# Patient Record
Sex: Female | Born: 1960 | Race: White | Hispanic: No | Marital: Married | Smoking: Former smoker
Health system: Southern US, Community
[De-identification: ages and names within clinical notes are randomized; demographics above are authoritative.]

## PROBLEM LIST (undated history)

## (undated) DIAGNOSIS — J309 Allergic rhinitis, unspecified: Secondary | ICD-10-CM

## (undated) DIAGNOSIS — E559 Vitamin D deficiency, unspecified: Secondary | ICD-10-CM

## (undated) DIAGNOSIS — E079 Disorder of thyroid, unspecified: Secondary | ICD-10-CM

## (undated) DIAGNOSIS — E7800 Pure hypercholesterolemia, unspecified: Secondary | ICD-10-CM

## (undated) HISTORY — DX: Disorder of thyroid, unspecified: E07.9

## (undated) HISTORY — DX: Pure hypercholesterolemia, unspecified: E78.00

## (undated) HISTORY — PX: APPENDECTOMY: SHX54

## (undated) HISTORY — DX: Allergic rhinitis, unspecified: J30.9

## (undated) HISTORY — DX: Vitamin D deficiency, unspecified: E55.9

---

## 1981-07-10 HISTORY — PX: FASCIOTOMY: SHX132

## 2005-10-20 ENCOUNTER — Ambulatory Visit: Payer: Self-pay | Admitting: General Surgery

## 2006-11-23 ENCOUNTER — Ambulatory Visit: Payer: Self-pay

## 2008-05-13 ENCOUNTER — Ambulatory Visit: Payer: Self-pay | Admitting: Family Medicine

## 2008-06-24 DIAGNOSIS — D239 Other benign neoplasm of skin, unspecified: Secondary | ICD-10-CM

## 2008-06-24 HISTORY — DX: Other benign neoplasm of skin, unspecified: D23.9

## 2008-12-18 ENCOUNTER — Ambulatory Visit: Payer: Self-pay

## 2009-11-12 ENCOUNTER — Ambulatory Visit: Payer: Self-pay | Admitting: Family Medicine

## 2011-07-10 ENCOUNTER — Ambulatory Visit: Payer: Self-pay | Admitting: Family Medicine

## 2012-02-09 ENCOUNTER — Ambulatory Visit: Payer: Self-pay | Admitting: Family Medicine

## 2012-12-03 ENCOUNTER — Ambulatory Visit: Payer: Self-pay | Admitting: Family Medicine

## 2015-03-09 DIAGNOSIS — E559 Vitamin D deficiency, unspecified: Secondary | ICD-10-CM | POA: Insufficient documentation

## 2015-03-09 DIAGNOSIS — E7800 Pure hypercholesterolemia, unspecified: Secondary | ICD-10-CM | POA: Insufficient documentation

## 2015-03-09 DIAGNOSIS — Z720 Tobacco use: Secondary | ICD-10-CM | POA: Insufficient documentation

## 2015-03-09 DIAGNOSIS — E039 Hypothyroidism, unspecified: Secondary | ICD-10-CM | POA: Insufficient documentation

## 2015-03-09 DIAGNOSIS — J309 Allergic rhinitis, unspecified: Secondary | ICD-10-CM | POA: Insufficient documentation

## 2015-03-10 ENCOUNTER — Ambulatory Visit (INDEPENDENT_AMBULATORY_CARE_PROVIDER_SITE_OTHER): Payer: BC Managed Care – PPO | Admitting: Family Medicine

## 2015-03-10 ENCOUNTER — Encounter: Payer: Self-pay | Admitting: Family Medicine

## 2015-03-10 VITALS — BP 102/70 | HR 87 | Temp 97.7°F | Resp 14 | Ht 63.5 in | Wt 131.6 lb

## 2015-03-10 DIAGNOSIS — J301 Allergic rhinitis due to pollen: Secondary | ICD-10-CM | POA: Diagnosis not present

## 2015-03-10 DIAGNOSIS — Z720 Tobacco use: Secondary | ICD-10-CM | POA: Diagnosis not present

## 2015-03-10 DIAGNOSIS — E039 Hypothyroidism, unspecified: Secondary | ICD-10-CM

## 2015-03-10 DIAGNOSIS — Z0000 Encounter for general adult medical examination without abnormal findings: Secondary | ICD-10-CM | POA: Diagnosis not present

## 2015-03-10 DIAGNOSIS — E559 Vitamin D deficiency, unspecified: Secondary | ICD-10-CM

## 2015-03-10 MED ORDER — FLUTICASONE PROPIONATE 50 MCG/ACT NA SUSP: 2.0000 | Freq: Every day | NASAL | Status: DC

## 2015-03-10 NOTE — Progress Notes (Signed)
Subjective:     Patient ID: Cindy Rangel, female   DOB: [birth year], 54 y.o.   MRN: [id number]  HPI  Chief Complaint  Patient presents with  . Annual Exam    Patient presents in office today for her annual physical, she states that she has no questions or concerns at this time. Last tdap 03/26/2009, Pap smear with HPV 02/12/14 negative, Colonoscopy 10/20/05 normal. Patient is due today for screening labs and mammogram  States she also wishes refills on her allergy medication.   Review of Systems General: Feeling well HEENT: regular dental visits and eye exams (contacts). _+ seasonal allergies usually treated with Claritin and Flonase spray. Cardiovascular: no chest pain, shortness of breath, or palpitations GI: no heartburn, no change in bowel habits  GU: nocturia x 1, no change in bladder habits  Psychiatric: not depressed: PHQ 2: 0 Musculoskeletal: no joint pain    Objective:   Physical Exam  Constitutional: She appears well-developed and well-nourished. No distress.  Eyes: PERRLA, EOMI Neck: no thyromegaly, tenderness or nodules, no cervical adenopathy, carotids palpable without bruit Breast: no mass/nipple discharge/axillary adenopathy  ENT: TM's intact without inflammation; No tonsillar enlargement or exudate, Lungs: Clear Heart : RRR without murmur or gallop Abd: bowel sounds present, soft, non-tender, no organomegaly Extremities: no edema Skin: no atypical lesions noted.     Assessment:    1. Annual physical exam - Comprehensive metabolic panel - Lipid panel - MM DIGITAL SCREENING BILATERAL; Future  2. Hypothyroidism, unspecified hypothyroidism type - T4, free - TSH  3. Allergic rhinitis due to pollen - fluticasone (FLONASE) 50 MCG/ACT nasal spray; Place 2 sprays into both nostrils daily.  Dispense: 16 g; Refill: 5  4. Vitamin D deficiency - Vit D  25 hydroxy (rtn osteoporosis monitoring)  5. Tobacco use: prior Chantix use with "bad dreams" and return to cigarette  use.     Plan:    Further f/u pending lab work. She intends to try nicotine product for smoking cessation.

## 2015-03-10 NOTE — Patient Instructions (Signed)
We will call you with lab results.       

## 2015-03-11 ENCOUNTER — Telehealth: Payer: Self-pay

## 2015-03-11 LAB — COMPREHENSIVE METABOLIC PANEL
A/G RATIO: 1.5 (ref 1.1–2.5)
ALK PHOS: 71 IU/L (ref 39–117)
ALT: 22 IU/L (ref 0–32)
AST: 22 IU/L (ref 0–40)
Albumin: 4.2 g/dL (ref 3.5–5.5)
BILIRUBIN TOTAL: 0.3 mg/dL (ref 0.0–1.2)
BUN/Creatinine Ratio: 30 — ABNORMAL HIGH (ref 9–23)
BUN: 19 mg/dL (ref 6–24)
CHLORIDE: 99 mmol/L (ref 97–108)
CO2: 24 mmol/L (ref 18–29)
Calcium: 9.6 mg/dL (ref 8.7–10.2)
Creatinine, Ser: 0.63 mg/dL (ref 0.57–1.00)
GFR calc Af Amer: 118 mL/min/{1.73_m2} (ref >59)
GFR, EST NON AFRICAN AMERICAN: 103 mL/min/{1.73_m2} (ref >59)
GLOBULIN, TOTAL: 2.8 g/dL (ref 1.5–4.5)
Glucose: 89 mg/dL (ref 65–99)
POTASSIUM: 4.6 mmol/L (ref 3.5–5.2)
SODIUM: 137 mmol/L (ref 134–144)
Total Protein: 7 g/dL (ref 6.0–8.5)

## 2015-03-11 LAB — T4, FREE: FREE T4: 1.56 ng/dL (ref 0.82–1.77)

## 2015-03-11 LAB — TSH: TSH: 3.16 u[IU]/mL (ref 0.450–4.500)

## 2015-03-11 LAB — LIPID PANEL
CHOL/HDL RATIO: 3.4 ratio (ref 0.0–4.4)
Cholesterol, Total: 233 mg/dL — ABNORMAL HIGH (ref 100–199)
HDL: 69 mg/dL (ref >39)
LDL Calculated: 148 mg/dL — ABNORMAL HIGH (ref 0–99)
TRIGLYCERIDES: 78 mg/dL (ref 0–149)
VLDL Cholesterol Cal: 16 mg/dL (ref 5–40)

## 2015-03-11 LAB — VITAMIN D 25 HYDROXY (VIT D DEFICIENCY, FRACTURES): Vit D, 25-Hydroxy: 23.8 ng/mL — ABNORMAL LOW (ref 30.0–100.0)

## 2015-03-11 NOTE — Telephone Encounter (Signed)
-----   Message from Carmon Ginsberg, [place] sent at 03/11/2015  7:49 AM EDT ----- Mild decrease in your Vitamin D should respond to 800 units daily over the counter. Thyroid is good at present dose. Cholesterol is mildly elevated but your 10 year risk for developing cardiovascular disease is low at 2.6%. Encourage trial on nicotine gum or patches to stop smoking.

## 2015-03-12 NOTE — Telephone Encounter (Signed)
[id number]-KW 

## 2015-03-16 NOTE — Telephone Encounter (Signed)
[id number]-KW 

## 2015-03-19 NOTE — Telephone Encounter (Signed)
Pt advised-aa 

## 2015-03-31 ENCOUNTER — Ambulatory Visit
Admission: RE | Admit: 2015-03-31 | Discharge: 2015-03-31 | Disposition: A | Discharge status: Home / Self Care | Payer: BC Managed Care – PPO | Source: Ambulatory Visit | Attending: Family Medicine | Admitting: Family Medicine

## 2015-03-31 ENCOUNTER — Ambulatory Visit: Payer: Self-pay

## 2015-03-31 DIAGNOSIS — Z0000 Encounter for general adult medical examination without abnormal findings: Secondary | ICD-10-CM | POA: Diagnosis present

## 2015-03-31 DIAGNOSIS — Z1231 Encounter for screening mammogram for malignant neoplasm of breast: Secondary | ICD-10-CM | POA: Insufficient documentation

## 2015-05-27 ENCOUNTER — Other Ambulatory Visit: Payer: Self-pay | Admitting: Family Medicine

## 2015-05-27 DIAGNOSIS — E039 Hypothyroidism, unspecified: Secondary | ICD-10-CM

## 2015-05-27 MED ORDER — LEVOTHYROXINE SODIUM 88 MCG PO TABS: 88.0000 ug | ORAL_TABLET | Freq: Every day | ORAL | Status: DC

## 2016-02-01 ENCOUNTER — Ambulatory Visit (INDEPENDENT_AMBULATORY_CARE_PROVIDER_SITE_OTHER): Payer: BC Managed Care – PPO | Admitting: Family Medicine

## 2016-02-01 ENCOUNTER — Encounter: Payer: Self-pay | Admitting: Family Medicine

## 2016-02-01 VITALS — BP 106/64 | HR 88 | Temp 98.4°F | Wt 130.0 lb

## 2016-02-01 DIAGNOSIS — J301 Allergic rhinitis due to pollen: Secondary | ICD-10-CM

## 2016-02-01 DIAGNOSIS — J40 Bronchitis, not specified as acute or chronic: Secondary | ICD-10-CM

## 2016-02-01 MED ORDER — CEFDINIR 300 MG PO CAPS: 300.0000 mg | ORAL_CAPSULE | Freq: Two times a day (BID) | ORAL | 0 refills | Status: DC

## 2016-02-01 NOTE — Progress Notes (Signed)
Subjective:     Patient ID: Cindy Rangel, female   DOB: 01/09/1961, 55 y.o.   MRN: [id number]  HPI  Chief Complaint  Patient presents with  . Bronchitis    Pt was seen June 3rd at Urgent Care for Bronchitis; she was prescribed an Antibiotic, inhaler and prednisone.  Pt report feeling better but still some coughing, wheezing, and feeling congested.   States she was placed on amoxicillin for 10 days but sputum did not clear.Reports continued cough with yellowish sputum but no shortness of breath. States she has mild clear sinus congestion c/w her allergies and she has resumed Flonase otc last night.   Review of Systems     Objective:   Physical Exam  Constitutional: She appears well-developed and well-nourished. No distress.  Ears: T.M's intact without inflammation Throat: no tonsillar enlargement or exudate Neck: no cervical adenopathy Lungs: posterior bases with transient inspiratory wheeze/coarse breath sounds.     Assessment:    1. Allergic rhinitis due to pollen  2. Bronchitis- cefdinir (OMNICEF) 300 MG capsule; Take 1 capsule (300 mg total) by mouth 2 (two) times daily.  Dispense: 14 capsule; Refill: 0    Plan:    Continue steroid nasal spray. Discussed use of Mucinex and Delsym.

## 2016-02-01 NOTE — Patient Instructions (Signed)
Continue steroid nasal spray. May add Mucinex or similar. Delsym for cough.

## 2016-04-19 ENCOUNTER — Ambulatory Visit (INDEPENDENT_AMBULATORY_CARE_PROVIDER_SITE_OTHER): Payer: BC Managed Care – PPO | Admitting: Physician Assistant

## 2016-04-19 ENCOUNTER — Encounter: Payer: Self-pay | Admitting: Physician Assistant

## 2016-04-19 VITALS — BP 106/78 | HR 80 | Temp 98.7°F | Resp 16 | Wt 128.0 lb

## 2016-04-19 DIAGNOSIS — R42 Dizziness and giddiness: Secondary | ICD-10-CM | POA: Diagnosis not present

## 2016-04-19 DIAGNOSIS — G4489 Other headache syndrome: Secondary | ICD-10-CM | POA: Diagnosis not present

## 2016-04-19 DIAGNOSIS — Z23 Encounter for immunization: Secondary | ICD-10-CM | POA: Diagnosis not present

## 2016-04-19 NOTE — Patient Instructions (Signed)

## 2016-04-19 NOTE — Progress Notes (Signed)
Patient: Cindy Rangel Female    DOB: 1961/01/23   55 y.o.   MRN: [id number] Visit Date: 04/19/2016  Today's Provider: Trinna Post, PA-C   Chief Complaint  Patient presents with  . Headache  . Dizziness   Subjective:    Headache   This is a new problem. The current episode started 1 to 4 weeks ago. The problem has been gradually worsening. The pain is located in the occipital and bilateral region. The pain does not radiate. The pain quality is not similar to prior headaches. The quality of the pain is described as dull and aching. The pain is at a severity of 3/10. The pain is mild. Associated symptoms include dizziness, rhinorrhea, scalp tenderness (Only on the right side pain comes and goes.) and a sore throat (Comes and goes.). Pertinent negatives include no abdominal pain, anorexia, blurred vision, coughing, ear pain, eye pain, eye redness, eye watering, fever, numbness, phonophobia, photophobia, seizures, sinus pressure, tinnitus, visual change or weakness.  Dizziness  This is a chronic problem. The current episode started more than 1 month ago. The problem has been gradually improving. Associated symptoms include headaches and a sore throat (Comes and goes.). Pertinent negatives include no abdominal pain, anorexia, chills, congestion, coughing, diaphoresis, fatigue, fever, numbness, visual change or weakness.   Patient reports new daily H/A x 1 mo, lasting for one hour. Headache goes away without medication. Headaches usually happen in afternoon or night. Not woken from sleep with headache. No eye discharge or rhinorrhea during headaches. No history of migraines, no aura. No changes in balance, vision, IVDU, no history of cancer. No trauma. Headaches are localized to top of head. 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 Patient reports allergies contorlled with steroid nasal spray. Patient recently went to family reunion and was concerned about the amount of diseases  in her family members.  Patient reports dizziness at different times of day after standing up from seated position. Patient describes "unsteadiness" that will go away shortly, and flushed feeling occasionally. Never Passed out. Doesn't happen while simply sitting or with exertion. Denies vertigo feeling, which she has had before. Dizziness not triggered by head movement or turning over in bed.    No Known Allergies   Current Outpatient Prescriptions:  .  Cholecalciferol (VITAMIN D) 2000 units CAPS, Take 1 capsule by mouth daily., Disp: , Rfl:  .  fluticasone (FLONASE) 50 MCG/ACT nasal spray, Place 2 sprays into both nostrils daily., Disp: 16 g, Rfl: 5 .  levothyroxine (SYNTHROID, LEVOTHROID) 88 MCG tablet, Take 1 tablet (88 mcg total) by mouth daily., Disp: 90 tablet, Rfl: 3 .  Multiple Vitamins-Minerals (MULTIVITAMIN GUMMIES WOMENS) CHEW, Chew by mouth., Disp: , Rfl:  .  Omega-3 Fatty Acids (FISH OIL) 1200 MG CAPS, Take by mouth., Disp: , Rfl:   Review of Systems  Constitutional: Negative for activity change, appetite change, chills, diaphoresis, fatigue, fever and unexpected weight change.  HENT: Positive for postnasal drip, rhinorrhea and sore throat (Comes and goes.). Negative for congestion, ear discharge, ear pain, nosebleeds, sinus pressure, sneezing, tinnitus, trouble swallowing and voice change.   Eyes: Negative for blurred vision, photophobia, pain, discharge, redness, itching and visual disturbance.  Respiratory: Negative for apnea, cough, choking, chest tightness, shortness of breath, wheezing and stridor.   Cardiovascular: Negative.   Gastrointestinal: Negative.  Negative for abdominal pain and anorexia.  Neurological: Positive for dizziness, light-headedness and headaches. Negative for tremors, seizures, syncope, weakness and  numbness.  Hematological: Does not bruise/bleed easily.    Social History  Substance Use Topics  . Smoking status: Current Every Day Smoker     Packs/day: 0.50    Types: Cigarettes  . Smokeless tobacco: Not on file  . Alcohol use 0.0 oz/week     Comment: rare   Objective:   BP 106/78 (BP Location: Left Arm, Patient Position: Sitting, Cuff Size: Normal)   Pulse 80   Temp 98.7 F (37.1 C) (Oral)   Resp 16   Wt 128 lb (58.1 kg)   BMI 22.32 kg/m   Outpatient Encounter Prescriptions as of 04/19/2016  Medication Sig Note  . Cholecalciferol (VITAMIN D) 2000 units CAPS Take 1 capsule by mouth daily.   . fluticasone (FLONASE) 50 MCG/ACT nasal spray Place 2 sprays into both nostrils daily.   Marland Kitchen levothyroxine (SYNTHROID, LEVOTHROID) 88 MCG tablet Take 1 tablet (88 mcg total) by mouth daily.   . Multiple Vitamins-Minerals (MULTIVITAMIN GUMMIES WOMENS) CHEW Chew by mouth. 03/09/2015: Received from: [facility]  . Omega-3 Fatty Acids (FISH OIL) 1200 MG CAPS Take by mouth. 03/09/2015: Received from: [facility]  . [DISCONTINUED] cefdinir (OMNICEF) 300 MG capsule Take 1 capsule (300 mg total) by mouth 2 (two) times daily.   . [DISCONTINUED] Vitamin D, Ergocalciferol, (DRISDOL) 50000 UNITS CAPS capsule Take by mouth. 03/09/2015: Received from: [facility]   No facility-administered encounter medications on file as of 04/19/2016.    Orthostatic Vital Signs Supine: 104/70, P64 Sit: 102/70, P68 Stand: 98/68, P76  Physical Exam  Constitutional: She is oriented to person, place, and time. She appears well-developed and well-nourished. No distress.  HENT:  Head: Normocephalic and atraumatic.  Right Ear: External ear normal.  Left Ear: External ear normal.  Nose: Nose normal.  Mouth/Throat: Oropharynx is clear and moist. No oropharyngeal exudate.  Eyes: Pupils are equal, round, and reactive to light. Right eye exhibits no discharge. Left eye exhibits no discharge.  Neck: Normal range of motion and full passive range of motion without pain. Neck supple. No muscular tenderness present. No  neck rigidity. No edema and normal range of motion present. No Kernig's sign noted.  Cardiovascular: Normal rate, regular rhythm and normal heart sounds.  Exam reveals no gallop and no friction rub.   No murmur heard. Pulmonary/Chest: Effort normal and breath sounds normal.  Neurological: She is alert and oriented to person, place, and time. No cranial nerve deficit or sensory deficit. She exhibits normal muscle tone. She displays a negative Romberg sign. Coordination and gait normal.  Skin: Skin is warm and dry. She is not diaphoretic.  Psychiatric: She has a normal mood and affect. Her behavior is normal.        Assessment & Plan:      Problem List Items Addressed This Visit    None    Visit Diagnoses    Dizziness    -  Primary   Relevant Orders   Comprehensive Metabolic Panel (CMET)   CBC with Differential   Other headache syndrome       Relevant Orders   Ambulatory referral to Neurology   Need for influenza vaccination       Relevant Orders   Flu Vaccine QUAD 36+ mos PF IM (Fluarix & Fluzone Quad PF) (Completed)     Dizziness Checking labs as above. Performed orthostatics today in the office, which were normal. Patient's dizziness occurs in context of standing up, not simply sitting there, turning her head, or with exertion.  No focal neurological deficits on exam. Patient admittedly does not drink enough fluids. No suspect medications. Push fluids, move from sitting to standing position slowly.   Headache Patient does not display many red flag symptoms for headache except for new onset and age greater than 30. Reassuring symptoms are lack of night time awakenings, no neck stiffness, no  history of cancer or IVDU, no trauma, no nausea/vomiting, no focal neurological deficits on exam. Counseled patient that she may treat symptomatically with pain relief, hydrate adequately, focus on sleep and stress relief. Will refer patient to neurology as patient is very concerned about these  symptoms. Patient may manage pain with Tylenol or NSAIDs. Safe limits discussed.  Return if symptoms worsen or fail to improve.       Patient Instructions  General Headache Without Cause A headache is pain or discomfort felt around the head or neck area. The specific cause of a headache may not be found. There are many causes and types of headaches. A few common ones are:  Tension headaches.  Migraine headaches.  Cluster headaches.  Chronic daily headaches. HOME CARE INSTRUCTIONS  Watch your condition for any changes. Take these steps to help with your condition: Managing Pain  Take over-the-counter and prescription medicines only as told by your health care provider.  Lie down in a dark, quiet room when you have a headache.  If directed, apply ice to the head and neck area:  Put ice in a plastic bag.  Place a towel between your skin and the bag.  Leave the ice on for 20 minutes, 2-3 times per day.  Use a heating pad or hot shower to apply heat to the head and neck area as told by your health care provider.  Keep lights dim if bright lights bother you or make your headaches worse. Eating and Drinking  Eat meals on a regular schedule.  Limit alcohol use.  Decrease the amount of caffeine you drink, or stop drinking caffeine. General Instructions  Keep all follow-up visits as told by your health care provider. This is important.  Keep a headache journal to help find out what may trigger your headaches. For example, write down:  What you eat and drink.  How much sleep you get.  Any change to your diet or medicines.  Try massage or other relaxation techniques.  Limit stress.  Sit up straight, and do not tense your muscles.  Do not use tobacco products, including cigarettes, chewing tobacco, or e-cigarettes. If you need help quitting, ask your health care provider.  Exercise regularly as told by your health care provider.  Sleep on a regular schedule. Get  7-9 hours of sleep, or the amount recommended by your health care provider. SEEK MEDICAL CARE IF:   Your symptoms are not helped by medicine.  You have a headache that is different from the usual headache.  You have nausea or you vomit.  You have a fever. SEEK IMMEDIATE MEDICAL CARE IF:   Your headache becomes severe.  You have repeated vomiting.  You have a stiff neck.  You have a loss of vision.  You have problems with speech.  You have pain in the eye or ear.  You have muscular weakness or loss of muscle control.  You lose your balance or have trouble walking.  You feel faint or pass out.  You have confusion.   This information is not intended to replace advice given to you by your health care provider. Make sure you  discuss any questions you have with your health care provider.   Document Released: 06/26/2005 Document Revised: 03/17/2015 Document Reviewed: 10/19/2014 Elsevier Interactive Patient Education 2016 [place], [facility]

## 2016-04-20 ENCOUNTER — Telehealth: Payer: Self-pay

## 2016-04-20 LAB — CBC WITH DIFFERENTIAL/PLATELET
Basophils Absolute: 0 10*3/uL (ref 0.0–0.2)
Basos: 1 %
EOS (ABSOLUTE): 0.1 10*3/uL (ref 0.0–0.4)
Eos: 2 %
Hematocrit: 41.2 % (ref 34.0–46.6)
Hemoglobin: 14.1 g/dL (ref 11.1–15.9)
Immature Grans (Abs): 0 10*3/uL (ref 0.0–0.1)
Immature Granulocytes: 0 %
Lymphocytes Absolute: 2.5 10*3/uL (ref 0.7–3.1)
Lymphs: 39 %
MCH: 30.3 pg (ref 26.6–33.0)
MCHC: 34.2 g/dL (ref 31.5–35.7)
MCV: 89 fL (ref 79–97)
Monocytes Absolute: 0.6 10*3/uL (ref 0.1–0.9)
Monocytes: 9 %
Neutrophils Absolute: 3 10*3/uL (ref 1.4–7.0)
Neutrophils: 49 %
Platelets: 297 10*3/uL (ref 150–379)
RBC: 4.65 x10E6/uL (ref 3.77–5.28)
RDW: 12.7 % (ref 12.3–15.4)
WBC: 6.3 10*3/uL (ref 3.4–10.8)

## 2016-04-20 LAB — COMPREHENSIVE METABOLIC PANEL
ALT: 24 IU/L (ref 0–32)
AST: 22 IU/L (ref 0–40)
Albumin/Globulin Ratio: 1.8 (ref 1.2–2.2)
Albumin: 4.6 g/dL (ref 3.5–5.5)
Alkaline Phosphatase: 81 IU/L (ref 39–117)
BUN/Creatinine Ratio: 40 — ABNORMAL HIGH (ref 9–23)
BUN: 29 mg/dL — ABNORMAL HIGH (ref 6–24)
Bilirubin Total: 0.3 mg/dL (ref 0.0–1.2)
CO2: 25 mmol/L (ref 18–29)
Calcium: 9.8 mg/dL (ref 8.7–10.2)
Chloride: 102 mmol/L (ref 96–106)
Creatinine, Ser: 0.72 mg/dL (ref 0.57–1.00)
GFR calc Af Amer: 110 mL/min/{1.73_m2} (ref >59)
GFR calc non Af Amer: 95 mL/min/{1.73_m2} (ref >59)
Globulin, Total: 2.6 g/dL (ref 1.5–4.5)
Glucose: 90 mg/dL (ref 65–99)
Potassium: 4.6 mmol/L (ref 3.5–5.2)
Sodium: 142 mmol/L (ref 134–144)
Total Protein: 7.2 g/dL (ref 6.0–8.5)

## 2016-04-20 NOTE — Telephone Encounter (Signed)
-----   Message from Trinna Post, [place] sent at 04/20/2016  9:06 AM EDT ----- CBC did not show signs of anemia. CMP consistent with dehydration. Push fluids to see if this helps with dizziness. Please advise patient, thank you.

## 2016-04-20 NOTE — Telephone Encounter (Signed)
lmtcb Efren Kross Drozdowski, CMA  

## 2016-04-24 NOTE — Telephone Encounter (Signed)
LMTCB

## 2016-04-25 NOTE — Telephone Encounter (Signed)
It might take a little longer for fluid volume to equilibrate. In absence of documented orthostatic hypotension and vertigo, may be little medication to be prescribed. But if she wishes, patient may come back in to be re-evaluated ito see if there are any new signs/symptoms.

## 2016-04-25 NOTE — Telephone Encounter (Signed)
Pt advised.  She reports that she has increased fluids and that has not helped.  She has an appointment with Neurology November 10th but she can't wait that long.    She says she is dizzy everyday and her work.   Please advised.   Thanks,   -Mickel Baas

## 2016-04-26 NOTE — Telephone Encounter (Signed)
Pt advised.   Thanks,   -Cash Duce  

## 2016-04-27 ENCOUNTER — Encounter: Payer: Self-pay | Admitting: Physician Assistant

## 2016-04-27 ENCOUNTER — Ambulatory Visit (INDEPENDENT_AMBULATORY_CARE_PROVIDER_SITE_OTHER): Payer: BC Managed Care – PPO | Admitting: Physician Assistant

## 2016-04-27 ENCOUNTER — Telehealth: Payer: Self-pay | Admitting: Physician Assistant

## 2016-04-27 VITALS — BP 128/70 | HR 76 | Temp 98.4°F | Resp 16 | Wt 128.0 lb

## 2016-04-27 DIAGNOSIS — R42 Dizziness and giddiness: Secondary | ICD-10-CM | POA: Diagnosis not present

## 2016-04-27 MED ORDER — PROCHLORPERAZINE MALEATE 5 MG PO TABS: 5.0000 mg | ORAL_TABLET | Freq: Four times a day (QID) | ORAL | 0 refills | Status: DC | PRN

## 2016-04-27 MED ORDER — MECLIZINE HCL 12.5 MG PO TABS: 12.5000 mg | ORAL_TABLET | Freq: Three times a day (TID) | ORAL | 0 refills | Status: AC | PRN

## 2016-04-27 NOTE — Patient Instructions (Signed)

## 2016-04-27 NOTE — Progress Notes (Signed)
Patient: Cindy Rangel Female    DOB: December 21, 1960   55 y.o.   MRN: [id number] Visit Date: 04/27/2016  Today's Provider: Trinna Post, PA-C   Chief Complaint  Patient presents with  . Dizziness   Subjective:    Dizziness  This is a recurrent problem. The current episode started 1 to 4 weeks ago. The problem occurs constantly. The problem has been gradually worsening. Associated symptoms include abdominal pain, congestion, fatigue, headaches, myalgias (Pt also complains of left leg pain.  ) and nausea. Pertinent negatives include no arthralgias, chills, diaphoresis, fever, joint swelling, neck pain, numbness, sore throat, vomiting or weakness.   Patient is a 55 y/o female w/ hypothyroidism who was seen last week in clinic for dizziness presenting today with ongoing complaints of dizziness. On her initial visit, patient described dizziness when standing up that quickly dissipated if she stood still. Denied feeling of vertigo which she has had before. Orthostatics and CBC were normal, CMP indicated dehydration. She pushed fluids with no relief. Today she is complaining of ongoing dizziness that worsens when she moves her head or moves in general. She denies the room spinning, but says she feels like she herself is spinning. She reports having to stay home from work and had to have her daughter drive her here because she felt unable to drive. She endorses a feeling of fullness in her ears bilaterally. She denies hearing loss, but reports when she holds the phone up to her left ear, the phone sounds "garbled." She reports she can understand me as I speak to her. She endorses some associated nausea, but no vomiting, She endorses a dull headache. She denies any trauma, fever, chills, neck stiffness. Patient says she feels better when she closes her eyes.  No Known Allergies   Current Outpatient Prescriptions:  .  Cholecalciferol (VITAMIN D) 2000 units CAPS, Take 1 capsule by mouth daily.,  Disp: , Rfl:  .  fluticasone (FLONASE) 50 MCG/ACT nasal spray, Place 2 sprays into both nostrils daily., Disp: 16 g, Rfl: 5 .  levothyroxine (SYNTHROID, LEVOTHROID) 88 MCG tablet, Take 1 tablet (88 mcg total) by mouth daily., Disp: 90 tablet, Rfl: 3 .  Multiple Vitamins-Minerals (MULTIVITAMIN GUMMIES WOMENS) CHEW, Chew by mouth., Disp: , Rfl:  .  Omega-3 Fatty Acids (FISH OIL) 1200 MG CAPS, Take by mouth., Disp: , Rfl:  .  meclizine (ANTIVERT) 12.5 MG tablet, Take 1 tablet (12.5 mg total) by mouth 3 (three) times daily as needed for dizziness., Disp: 42 tablet, Rfl: 0 .  prochlorperazine (COMPAZINE) 5 MG tablet, Take 1 tablet (5 mg total) by mouth every 6 (six) hours as needed for nausea or vomiting., Disp: 56 tablet, Rfl: 0  Review of Systems  Constitutional: Positive for appetite change (Appetitis is not great. ) and fatigue. Negative for activity change, chills, diaphoresis, fever and unexpected weight change.  HENT: Positive for congestion, postnasal drip and rhinorrhea. Negative for ear discharge, ear pain, nosebleeds, sinus pressure, sneezing, sore throat, tinnitus and trouble swallowing.        Pt says her ears feel full and also maybe some hearing loss.    Respiratory: Negative.   Cardiovascular: Negative.   Gastrointestinal: Positive for abdominal pain and nausea. Negative for abdominal distention, anal bleeding, blood in stool, constipation, diarrhea, rectal pain and vomiting.  Musculoskeletal: Positive for gait problem (Unsteady gait.  Pt is having to hold on to wall while she walks. ) and myalgias (Pt also complains of left  leg pain.  ). Negative for arthralgias, back pain, joint swelling, neck pain and neck stiffness.  Allergic/Immunologic: Positive for environmental allergies.  Neurological: Positive for dizziness, light-headedness and headaches. Negative for tremors, seizures, syncope, speech difficulty, weakness and numbness.    Social History  Substance Use Topics  . Smoking  status: Current Every Day Smoker    Packs/day: 0.50    Types: Cigarettes  . Smokeless tobacco: Not on file  . Alcohol use 0.0 oz/week     Comment: rare   Objective:   BP 128/70 (BP Location: Left Arm, Patient Position: Sitting, Cuff Size: Normal)   Pulse 76   Temp 98.4 F (36.9 C) (Oral)   Resp 16   Wt 128 lb (58.1 kg)   BMI 22.32 kg/m   Physical Exam  Constitutional: She is oriented to person, place, and time. She appears well-developed and well-nourished. She does not have a sickly appearance. No distress.  HENT:  Right Ear: Tympanic membrane and external ear normal.  Left Ear: Tympanic membrane and external ear normal.  Mouth/Throat: Oropharynx is clear and moist. No oropharyngeal exudate.  Dix Hallpike test negative. No nystagmus noted.   Neurological: She is alert and oriented to person, place, and time. She has normal reflexes. She displays normal reflexes. No cranial nerve deficit or sensory deficit. She exhibits normal muscle tone. She displays a negative Romberg sign. Coordination and gait normal. GCS eye subscore is 4. GCS verbal subscore is 5. GCS motor subscore is 6.  No pronator drift. Tandem gait in tact. Patient able to perform FNF bilaterally.   Skin: Skin is warm and dry. She is not diaphoretic.        Assessment & Plan:      Problem List Items Addressed This Visit    None    Visit Diagnoses    Dizziness    -  Primary   Relevant Medications   meclizine (ANTIVERT) 12.5 MG tablet   prochlorperazine (COMPAZINE) 5 MG tablet   Other Relevant Orders   Ambulatory referral to ENT     Patient is 55 y/o relatively healthy woman presenting with dizziness. Patient's symptoms do not quite match vertigo presentation. However, will treat as vertigo for now with antihistamine and antiemetic as above. No concerning focal neurological deficits on exam. Patient offered head Ct, but she declines. Will refer to ENT for further evaluation. Work note provided.   Return if  symptoms worsen or fail to improve.  The entirety of the information documented in the History of Present Illness, Review of Systems and Physical Exam were personally obtained by me. Portions of this information were initially documented by Ashley Royalty, CMA and reviewed by me for thoroughness and accuracy.   Patient Instructions  Vertigo Vertigo means you feel like you or your surroundings are moving when they are not. Vertigo can be dangerous if it occurs when you are at work, driving, or performing difficult activities.  CAUSES  Vertigo occurs when there is a conflict of signals sent to your brain from the visual and sensory systems in your body. There are many different causes of vertigo, including:  Infections, especially in the inner ear.  A bad reaction to a drug or misuse of alcohol and medicines.  Withdrawal from drugs or alcohol.  Rapidly changing positions, such as lying down or rolling over in bed.  A migraine headache.  Decreased blood flow to the brain.  Increased pressure in the brain from a head injury, infection, tumor, or bleeding. SYMPTOMS  You may feel as though the world is spinning around or you are falling to the ground. Because your balance is upset, vertigo can cause nausea and vomiting. You may have involuntary eye movements (nystagmus). DIAGNOSIS  Vertigo is usually diagnosed by physical exam. If the cause of your vertigo is unknown, your caregiver may perform imaging tests, such as an MRI scan (magnetic resonance imaging). TREATMENT  Most cases of vertigo resolve on their own, without treatment. Depending on the cause, your caregiver may prescribe certain medicines. If your vertigo is related to body position issues, your caregiver may recommend movements or procedures to correct the problem. In rare cases, if your vertigo is caused by certain inner ear problems, you may need surgery. HOME CARE INSTRUCTIONS   Follow your caregiver's instructions.  Avoid  driving.  Avoid operating heavy machinery.  Avoid performing any tasks that would be dangerous to you or others during a vertigo episode.  Tell your caregiver if you notice that certain medicines seem to be causing your vertigo. Some of the medicines used to treat vertigo episodes can actually make them worse in some people. SEEK IMMEDIATE MEDICAL CARE IF:   Your medicines do not relieve your vertigo or are making it worse.  You develop problems with talking, walking, weakness, or using your arms, hands, or legs.  You develop severe headaches.  Your nausea or vomiting continues or gets worse.  You develop visual changes.  A family member notices behavioral changes.  Your condition gets worse. MAKE SURE YOU:  Understand these instructions.  Will watch your condition.  Will get help right away if you are not doing well or get worse.   This information is not intended to replace advice given to you by your health care provider. Make sure you discuss any questions you have with your health care provider.   Document Released: 04/05/2005 Document Revised: 09/18/2011 Document Reviewed: 10/19/2014 Elsevier Interactive Patient Education 2016 [place], [facility]

## 2016-04-27 NOTE — Telephone Encounter (Signed)
Patient told me when she was leaving her appt. today that her appt. with the neurologist needed to be cancelled.    Is this correct?  Please let Burman Freestone. Know so she can cancel it.

## 2016-04-28 NOTE — Telephone Encounter (Signed)
Neurology appointment was for headaches. Patient may keep if she wish, or cancel if she chooses. Patient may cancel this herself. Thank you.

## 2016-05-11 ENCOUNTER — Other Ambulatory Visit: Payer: Self-pay | Admitting: Family Medicine

## 2016-05-11 ENCOUNTER — Other Ambulatory Visit: Payer: Self-pay | Admitting: Otolaryngology

## 2016-05-11 DIAGNOSIS — Z1231 Encounter for screening mammogram for malignant neoplasm of breast: Secondary | ICD-10-CM

## 2016-05-11 DIAGNOSIS — H532 Diplopia: Secondary | ICD-10-CM

## 2016-05-11 DIAGNOSIS — R42 Dizziness and giddiness: Secondary | ICD-10-CM

## 2016-05-31 ENCOUNTER — Encounter: Payer: Self-pay | Admitting: Radiology

## 2016-05-31 ENCOUNTER — Ambulatory Visit
Admission: RE | Admit: 2016-05-31 | Discharge: 2016-05-31 | Disposition: A | Discharge status: Home / Self Care | Payer: BC Managed Care – PPO | Source: Ambulatory Visit | Attending: Otolaryngology | Admitting: Otolaryngology

## 2016-05-31 DIAGNOSIS — R42 Dizziness and giddiness: Secondary | ICD-10-CM | POA: Diagnosis not present

## 2016-05-31 DIAGNOSIS — H532 Diplopia: Secondary | ICD-10-CM | POA: Diagnosis present

## 2016-05-31 MED ORDER — GADOBENATE DIMEGLUMINE 529 MG/ML IV SOLN
10.0000 mL | Freq: Once | INTRAVENOUS | Status: AC | PRN
  Administered 2016-05-31: 10 mL via INTRAVENOUS

## 2016-06-14 ENCOUNTER — Ambulatory Visit
Admission: RE | Admit: 2016-06-14 | Discharge: 2016-06-14 | Disposition: A | Discharge status: Home / Self Care | Payer: BC Managed Care – PPO | Source: Ambulatory Visit | Attending: Family Medicine | Admitting: Family Medicine

## 2016-06-14 DIAGNOSIS — Z1231 Encounter for screening mammogram for malignant neoplasm of breast: Secondary | ICD-10-CM | POA: Diagnosis present

## 2016-06-17 ENCOUNTER — Other Ambulatory Visit: Payer: Self-pay | Admitting: Family Medicine

## 2016-06-17 DIAGNOSIS — E039 Hypothyroidism, unspecified: Secondary | ICD-10-CM

## 2016-08-17 ENCOUNTER — Encounter: Payer: Self-pay | Admitting: Family Medicine

## 2016-09-25 ENCOUNTER — Other Ambulatory Visit: Payer: Self-pay | Admitting: Family Medicine

## 2016-09-25 DIAGNOSIS — E039 Hypothyroidism, unspecified: Secondary | ICD-10-CM

## 2016-09-26 ENCOUNTER — Other Ambulatory Visit: Payer: Self-pay | Admitting: Family Medicine

## 2016-09-26 DIAGNOSIS — E039 Hypothyroidism, unspecified: Secondary | ICD-10-CM

## 2016-09-26 NOTE — Telephone Encounter (Signed)
[id number]-KW 

## 2016-09-26 NOTE — Telephone Encounter (Signed)
Please have her get thyroid labs which I have ordered.

## 2016-12-26 ENCOUNTER — Other Ambulatory Visit: Payer: Self-pay | Admitting: Family Medicine

## 2016-12-26 DIAGNOSIS — E039 Hypothyroidism, unspecified: Secondary | ICD-10-CM

## 2017-03-02 ENCOUNTER — Ambulatory Visit (INDEPENDENT_AMBULATORY_CARE_PROVIDER_SITE_OTHER): Payer: BC Managed Care – PPO | Admitting: Family Medicine

## 2017-03-02 ENCOUNTER — Encounter: Payer: Self-pay | Admitting: Family Medicine

## 2017-03-02 VITALS — BP 108/76 | HR 83 | Temp 98.1°F | Resp 16 | Ht 62.0 in | Wt 134.2 lb

## 2017-03-02 DIAGNOSIS — Z1211 Encounter for screening for malignant neoplasm of colon: Secondary | ICD-10-CM | POA: Diagnosis not present

## 2017-03-02 DIAGNOSIS — Z8639 Personal history of other endocrine, nutritional and metabolic disease: Secondary | ICD-10-CM

## 2017-03-02 DIAGNOSIS — E7800 Pure hypercholesterolemia, unspecified: Secondary | ICD-10-CM

## 2017-03-02 DIAGNOSIS — B351 Tinea unguium: Secondary | ICD-10-CM

## 2017-03-02 DIAGNOSIS — Z0000 Encounter for general adult medical examination without abnormal findings: Secondary | ICD-10-CM

## 2017-03-02 DIAGNOSIS — Z1159 Encounter for screening for other viral diseases: Secondary | ICD-10-CM | POA: Diagnosis not present

## 2017-03-02 DIAGNOSIS — Z23 Encounter for immunization: Secondary | ICD-10-CM

## 2017-03-02 DIAGNOSIS — Z720 Tobacco use: Secondary | ICD-10-CM

## 2017-03-02 DIAGNOSIS — E039 Hypothyroidism, unspecified: Secondary | ICD-10-CM

## 2017-03-02 MED ORDER — TERBINAFINE HCL 250 MG PO TABS: 250.0000 mg | ORAL_TABLET | Freq: Every day | ORAL | 0 refills | Status: DC

## 2017-03-02 NOTE — Patient Instructions (Addendum)
We will call you with the lab results and colonoscopy referral. Try Nicotine patches and gum to stop smoking. Call for mammogram order in December. Let me know if wish further investigation of your shoulder pain. Come back in > 2 months for second Shingrix vaccine.

## 2017-03-02 NOTE — Progress Notes (Signed)
Subjective:     Patient ID: Cindy Rangel, female   DOB: 06-24-1961, 56 y.o.   MRN: [id number]  HPI  Chief Complaint  Patient presents with  . Annual Exam    Patient comes into office today for her annual physical she states that she feels well today and has no questions or concerns. Patient follows a well balanced diet but is not actively exercising, patient averages between 5.5hrs of sleep at night and states that her libido is normal. Patient does perform monthly self breast checks and has not noticed any changes, last mamogram was 06/14/16, last pap exam patient reports <39yrs. Patient last Tdap 03/26/09, she request today flu and shingrix vaccines.   Also eligible for Pneumovax 23 due to 1/2 ppd cigarette habit. Due for colonoscopy. Has had thryoid evaluation earlier in the year during workup of vertigo per her report.   Review of Systems General: Feeling well HEENT: regular dental visits and eye exams (Fuch's dystrophy and contact lenses). Steroid nasal spray for allergies. Cardiovascular: no chest pain, shortness of breath, or palpitations GI: no heartburn, no change in bowel habits or blood in the stool GU: no change in bladder habits  Skin: wishes treatment for toenail fungus. Reports prior resolution years ago with oral medication. Psychiatric: not depressed Musculoskeletal: chronic anterior right shoulder pain which recently flared but improved with nsaid's    Objective:   Physical Exam  Constitutional: She appears well-developed and well-nourished. No distress.  Eyes: PERRLA, EOMI Neck: no thyromegaly, tenderness or nodules, no cervical adenopathy or carotid bruits ENT: TM's intact without inflammation; No tonsillar enlargement or exudate, Lungs: Clear Breasts: no axillary adenopathy, breast mass or nipple discharge. Heart : RRR without murmur or gallop Abd: bowel sounds present, soft, non-tender, no organomegaly Extremities: no pedal edema. Right shoulder with FROM and 5/5  strength. Mild increased anterior pain when rotated to touch shoulder blade. Skin: all toenails are dystrophic-thickened      Assessment:    1. Need for influenza vaccination - Flu Vaccine QUAD 36+ mos IM  2. Need for shingles vaccine - Varicella-zoster vaccine IM  3. Need for vaccination against Streptococcus pneumoniae - Pneumococcal polysaccharide vaccine 23-valent greater than or equal to 2yo subcutaneous/IM  4. Hypercholesteremia - Lipid panel  5. Adult hypothyroidism: continue current dose of levothyroxine  6. Annual physical exam - Comprehensive metabolic panel  7. Tobacco use: to try Nicotine products  8 Onychomycosis - terbinafine (LAMISIL) 250 MG tablet; Take 1 tablet (250 mg total) by mouth daily.  Dispense: 90 tablet; Refill: 0  9. History of vitamin D deficiency - VITAMIN D 25 Hydroxy (Vit-D Deficiency, Fractures)  10. Screen for colon cancer - Ambulatory referral to Gastroenterology  11. Encounter for hepatitis C screening test for low risk patient - Hepatitis C Antibody    Plan:    Further f/u pending lab work. To call for mammogram order in December. Shingrix #2 in > 2 months.

## 2017-03-08 ENCOUNTER — Telehealth: Payer: Self-pay | Admitting: Family Medicine

## 2017-03-08 NOTE — Telephone Encounter (Signed)
Patient was last seen 03/02/17 for CPE I didn't see any recent telephone encounter and when I looked under labs there is no results either, did you contact patient? KW

## 2017-03-08 NOTE — Telephone Encounter (Signed)
Advised patient and found out she was returning call for referral. Patient directed to our referral dept. KW

## 2017-03-08 NOTE — Telephone Encounter (Signed)
I did not call her but we might remind her to get her labs done.

## 2017-03-08 NOTE — Telephone Encounter (Signed)
Pt called saying she rec a call from our office but not sure what it was about.  Her call back number is [phone]  Thanks Con Memos

## 2017-03-23 LAB — COMPREHENSIVE METABOLIC PANEL
ALBUMIN: 4.2 g/dL (ref 3.5–5.5)
ALK PHOS: 85 IU/L (ref 39–117)
ALT: 17 IU/L (ref 0–32)
AST: 23 IU/L (ref 0–40)
Albumin/Globulin Ratio: 1.5 (ref 1.2–2.2)
BILIRUBIN TOTAL: 0.3 mg/dL (ref 0.0–1.2)
BUN/Creatinine Ratio: 27 — ABNORMAL HIGH (ref 9–23)
BUN: 16 mg/dL (ref 6–24)
CHLORIDE: 104 mmol/L (ref 96–106)
CO2: 25 mmol/L (ref 20–29)
Calcium: 9.4 mg/dL (ref 8.7–10.2)
Creatinine, Ser: 0.6 mg/dL (ref 0.57–1.00)
GFR calc Af Amer: 119 mL/min/{1.73_m2} (ref >59)
GFR calc non Af Amer: 103 mL/min/{1.73_m2} (ref >59)
GLOBULIN, TOTAL: 2.8 g/dL (ref 1.5–4.5)
GLUCOSE: 68 mg/dL (ref 65–99)
POTASSIUM: 4.6 mmol/L (ref 3.5–5.2)
SODIUM: 143 mmol/L (ref 134–144)
Total Protein: 7 g/dL (ref 6.0–8.5)

## 2017-03-23 LAB — LIPID PANEL
CHOLESTEROL TOTAL: 230 mg/dL — AB (ref 100–199)
Chol/HDL Ratio: 2.9 ratio (ref 0.0–4.4)
HDL: 79 mg/dL (ref >39)
LDL Calculated: 132 mg/dL — ABNORMAL HIGH (ref 0–99)
Triglycerides: 93 mg/dL (ref 0–149)
VLDL Cholesterol Cal: 19 mg/dL (ref 5–40)

## 2017-03-23 LAB — VITAMIN D 25 HYDROXY (VIT D DEFICIENCY, FRACTURES): Vit D, 25-Hydroxy: 27.4 ng/mL — ABNORMAL LOW (ref 30.0–100.0)

## 2017-03-23 LAB — HEPATITIS C ANTIBODY: Hep C Virus Ab: <0.1 {s_co_ratio} (ref 0.0–0.9)

## 2017-03-26 ENCOUNTER — Telehealth: Payer: Self-pay

## 2017-03-26 NOTE — Telephone Encounter (Signed)
Patient has been advised. KW 

## 2017-03-26 NOTE — Telephone Encounter (Signed)
-----   Message from Carmon Ginsberg, [place] sent at 03/26/2017  7:53 AM EDT ----- Labs are ok with mildly elevated cholesterol. Your current calculated 10 year risk for developing cardiovascular disease is 2.9%. If you quit smoking your risk goes down to 1.2%. We usually recommend cholesterol lowering drugs at risk of 7.5%.

## 2017-04-01 ENCOUNTER — Other Ambulatory Visit: Payer: Self-pay | Admitting: Family Medicine

## 2017-04-01 DIAGNOSIS — E039 Hypothyroidism, unspecified: Secondary | ICD-10-CM

## 2017-04-02 ENCOUNTER — Other Ambulatory Visit: Payer: Self-pay | Admitting: Family Medicine

## 2017-04-02 DIAGNOSIS — E039 Hypothyroidism, unspecified: Secondary | ICD-10-CM

## 2017-05-29 ENCOUNTER — Other Ambulatory Visit: Payer: Self-pay | Admitting: Family Medicine

## 2017-05-29 DIAGNOSIS — B351 Tinea unguium: Secondary | ICD-10-CM

## 2017-06-06 ENCOUNTER — Other Ambulatory Visit: Payer: Self-pay | Admitting: Family Medicine

## 2017-06-06 ENCOUNTER — Telehealth: Payer: Self-pay | Admitting: Gastroenterology

## 2017-06-06 DIAGNOSIS — Z1231 Encounter for screening mammogram for malignant neoplasm of breast: Secondary | ICD-10-CM

## 2017-06-06 NOTE — Telephone Encounter (Signed)
Patient left a voice message that she needs to schedule her colonoscopy. Please call

## 2017-06-06 NOTE — Telephone Encounter (Signed)
Returned patient's call to schedule.   LVM for callback.

## 2017-06-22 ENCOUNTER — Ambulatory Visit: Payer: BC Managed Care – PPO | Admitting: Family Medicine

## 2017-06-22 ENCOUNTER — Encounter: Payer: Self-pay | Admitting: Family Medicine

## 2017-06-22 VITALS — BP 122/70 | HR 80 | Temp 97.6°F | Resp 16 | Wt 139.0 lb

## 2017-06-22 DIAGNOSIS — J40 Bronchitis, not specified as acute or chronic: Secondary | ICD-10-CM | POA: Diagnosis not present

## 2017-06-22 MED ORDER — CEFDINIR 300 MG PO CAPS: 300.0000 mg | ORAL_CAPSULE | Freq: Two times a day (BID) | ORAL | 0 refills | Status: DC

## 2017-06-22 NOTE — Patient Instructions (Addendum)
Resume fluticasone nasal spray for each congestion. Continue Mucinex.

## 2017-06-22 NOTE — Progress Notes (Signed)
Subjective:     Patient ID: Cindy Rangel, female   DOB: 12-15-1960, 56 y.o.   MRN: [id number] Chief Complaint  Patient presents with  . Cough    Patient comes in office today with concerns of productive cough with phlegm for the past 4 weeks. Patient reports for the first two weeks of onset she had wheezing but it has improved, patient complains of bilateral ear pain. Patient has tried otc Delysm, Day & Nyquil, Advil Cold & Flu, Mucinex and cough drops.    HPI Reports recurrence of cough productive of purulent sputum. Has decreased smoking while ill. No fever, chills, or sinus congestion.  Review of Systems     Objective:   Physical Exam  Constitutional: She appears well-developed and well-nourished. No distress.  Ears: T.M's intact without inflammation; light reflex absent Throat: no tonsillar enlargement or exudate Neck: no cervical adenopathy Lungs: clear     Assessment:    1. Bronchitis - cefdinir (OMNICEF) 300 MG capsule; Take 1 capsule (300 mg total) by mouth 2 (two) times daily.  Dispense: 20 capsule; Refill: 0    Plan:    Continue Mucinex. Add fluticasone nasal spray for ear congestion.

## 2017-07-04 ENCOUNTER — Other Ambulatory Visit: Payer: Self-pay | Admitting: Family Medicine

## 2017-07-04 DIAGNOSIS — E039 Hypothyroidism, unspecified: Secondary | ICD-10-CM

## 2017-07-09 ENCOUNTER — Ambulatory Visit
Admission: RE | Admit: 2017-07-09 | Discharge: 2017-07-09 | Disposition: A | Discharge status: Home / Self Care | Payer: BC Managed Care – PPO | Source: Ambulatory Visit | Attending: Family Medicine | Admitting: Family Medicine

## 2017-07-09 DIAGNOSIS — Z1231 Encounter for screening mammogram for malignant neoplasm of breast: Secondary | ICD-10-CM | POA: Diagnosis present

## 2017-08-02 ENCOUNTER — Ambulatory Visit (INDEPENDENT_AMBULATORY_CARE_PROVIDER_SITE_OTHER): Payer: BC Managed Care – PPO | Admitting: Family Medicine

## 2017-08-02 DIAGNOSIS — E039 Hypothyroidism, unspecified: Secondary | ICD-10-CM | POA: Diagnosis not present

## 2017-08-02 DIAGNOSIS — Z23 Encounter for immunization: Secondary | ICD-10-CM

## 2017-08-02 NOTE — Patient Instructions (Signed)
We will call with lab results   

## 2017-08-14 ENCOUNTER — Telehealth: Payer: Self-pay

## 2017-08-14 LAB — TSH: TSH: 2.45 u[IU]/mL (ref 0.450–4.500)

## 2017-08-14 LAB — T4, FREE: Free T4: 1.51 ng/dL (ref 0.82–1.77)

## 2017-08-14 NOTE — Telephone Encounter (Signed)
[id number] 

## 2017-08-14 NOTE — Telephone Encounter (Signed)
-----   Message from Carmon Ginsberg, [place] sent at 08/14/2017  7:27 AM EST ----- Thyroid labs ok

## 2017-08-14 NOTE — Telephone Encounter (Signed)
Patient advised.KW 

## 2017-08-21 ENCOUNTER — Encounter: Payer: Self-pay | Admitting: Family Medicine

## 2017-08-21 ENCOUNTER — Ambulatory Visit (INDEPENDENT_AMBULATORY_CARE_PROVIDER_SITE_OTHER): Payer: BC Managed Care – PPO | Admitting: Family Medicine

## 2017-08-21 VITALS — BP 100/62 | HR 73 | Temp 97.8°F | Resp 16 | Wt 140.0 lb

## 2017-08-21 DIAGNOSIS — J0100 Acute maxillary sinusitis, unspecified: Secondary | ICD-10-CM

## 2017-08-21 MED ORDER — AMOXICILLIN-POT CLAVULANATE 875-125 MG PO TABS
1.0000 | ORAL_TABLET | Freq: Two times a day (BID) | ORAL | 0 refills | Status: DC
Start: 2017-08-21 — End: 2018-02-11

## 2017-08-21 MED ORDER — HYDROCODONE-HOMATROPINE 5-1.5 MG/5ML PO SYRP: ORAL_SOLUTION | ORAL | 0 refills | Status: DC

## 2017-08-21 NOTE — Patient Instructions (Signed)
Discussed use of Mucinex D. We will call you with the x-ray report.

## 2017-08-21 NOTE — Progress Notes (Signed)
Subjective:     Patient ID: Cindy Rangel, female   DOB: 04/11/1961, 57 y.o.   MRN: [id number] Chief Complaint  Patient presents with  . Cough    Patient returns to office today to address cough, patient was last seen on 06/12/17 and diagnosed with bronchitis and started on antibiotic. Patient states after completing antibiotic symptoms returned , patient reports productive cough for over 2weeks. Patient reports mid back pain when coughing, she describes pain in back as dull . Patient has been taking Tessalon for cough.    HPI Developed cold sx two weeks ago and went to Urgent Care. Was placed on Tessalon Perles with negative strep test. Patient reports increased sinus pressure, purulent sinus drainage, post nasal drainage and accompanying cough  Review of Systems     Objective:   Physical Exam  Constitutional: She appears well-developed and well-nourished. No distress.  Ears: T.M's intact without inflammation Sinuses: mild right maxillary sinus tenderness Throat: no tonsillar enlargement or exudate Neck: no cervical adenopathy Lungs: right basilar crackles     Assessment:    1. Acute maxillary sinusitis, recurrence not specified: r/o pneumonia - amoxicillin-clavulanate (AUGMENTIN) 875-125 MG tablet; Take 1 tablet by mouth 2 (two) times daily.  Dispense: 20 tablet; Refill: 0 - HYDROcodone-homatropine (HYCODAN) 5-1.5 MG/5ML syrup; 5 ml 4-6 hours as needed for cough  Dispense: 120 mL; Refill: 0 - DG Chest 2 View; Future    Plan:    Add Mucinex D. Further f/u pending x-ray report. States she is going to try the nicotine products to stop smoking.

## 2017-10-01 ENCOUNTER — Telehealth: Payer: Self-pay | Admitting: Family Medicine

## 2017-10-01 ENCOUNTER — Other Ambulatory Visit: Payer: Self-pay | Admitting: Family Medicine

## 2017-10-01 DIAGNOSIS — E039 Hypothyroidism, unspecified: Secondary | ICD-10-CM

## 2017-10-01 MED ORDER — LEVOTHYROXINE SODIUM 88 MCG PO TABS: 88.0000 ug | ORAL_TABLET | Freq: Every day | ORAL | 3 refills | Status: DC

## 2017-10-01 NOTE — Telephone Encounter (Signed)
[facility] faxed a refill request for a 90-days supply for the following medication. Thanks CC  levothyroxine (SYNTHROID, LEVOTHROID) 88 MCG tablet

## 2017-10-01 NOTE — Telephone Encounter (Signed)
done

## 2017-10-01 NOTE — Telephone Encounter (Signed)
Last filled 07/04/17, labs last drawn 08/13/17. KW

## 2018-02-11 ENCOUNTER — Ambulatory Visit
Admission: RE | Admit: 2018-02-11 | Discharge: 2018-02-11 | Disposition: A | Discharge status: Home / Self Care | Payer: BC Managed Care – PPO | Source: Ambulatory Visit | Attending: Family Medicine | Admitting: Family Medicine

## 2018-02-11 ENCOUNTER — Ambulatory Visit: Payer: BC Managed Care – PPO | Admitting: Family Medicine

## 2018-02-11 VITALS — BP 120/70 | HR 78 | Temp 97.9°F | Resp 16 | Wt 140.0 lb

## 2018-02-11 DIAGNOSIS — M546 Pain in thoracic spine: Secondary | ICD-10-CM | POA: Insufficient documentation

## 2018-02-11 DIAGNOSIS — G8929 Other chronic pain: Secondary | ICD-10-CM

## 2018-02-11 DIAGNOSIS — J40 Bronchitis, not specified as acute or chronic: Secondary | ICD-10-CM

## 2018-02-11 MED ORDER — AZITHROMYCIN 250 MG PO TABS: ORAL_TABLET | ORAL | 0 refills | Status: DC

## 2018-02-11 NOTE — Progress Notes (Signed)
  Subjective:     Patient ID: Cindy Rangel, female   DOB: [birth year], 57 y.o.   MRN: [id number] Chief Complaint  Patient presents with  . Cough   HPI States she has had continued productive cough (unable to characterize the sputum) since treated for a sinus infection in February. Also has chronic midline thoracic pain which was present before the cough: " I am worried about it." States she is only smoking a few cigarettes daily.  Review of Systems     Objective:   Physical Exam  Constitutional: She appears well-developed and well-nourished. No distress.  Musculoskeletal:  Localizes back pain over her mid-thoracic vertebra. Non-tender to the touch. No overlying erythema or rash.  Ears: T.M's intact without inflammation Throat: no tonsillar enlargement or exudate Neck: no cervical adenopathy Lungs: clear     Assessment:    1. Chronic midline thoracic back pain - DG Thoracic Spine W/Swimmers; Future  2. Bronchitis:rx for Zithromax. Consider spirometry/CXR    Plan:    Further f/u pending x-ray report.

## 2018-02-11 NOTE — Patient Instructions (Signed)
We will call you with the x-ray results. Let me know if your cough does not improve. Continue efforts to stop smoking.

## 2018-06-18 ENCOUNTER — Other Ambulatory Visit: Payer: Self-pay | Admitting: Family Medicine

## 2018-06-18 DIAGNOSIS — Z1231 Encounter for screening mammogram for malignant neoplasm of breast: Secondary | ICD-10-CM

## 2018-07-19 ENCOUNTER — Ambulatory Visit
Admission: RE | Admit: 2018-07-19 | Discharge: 2018-07-19 | Disposition: A | Discharge status: Home / Self Care | Payer: BC Managed Care – PPO | Source: Ambulatory Visit | Attending: Family Medicine | Admitting: Family Medicine

## 2018-07-19 DIAGNOSIS — Z1231 Encounter for screening mammogram for malignant neoplasm of breast: Secondary | ICD-10-CM | POA: Diagnosis present

## 2018-08-19 ENCOUNTER — Encounter: Payer: Self-pay | Admitting: Family Medicine

## 2018-08-19 ENCOUNTER — Ambulatory Visit: Payer: BC Managed Care – PPO | Admitting: Family Medicine

## 2018-08-19 VITALS — BP 120/76 | HR 66 | Temp 98.5°F | Resp 16 | Wt 148.6 lb

## 2018-08-19 DIAGNOSIS — E039 Hypothyroidism, unspecified: Secondary | ICD-10-CM | POA: Diagnosis not present

## 2018-08-19 DIAGNOSIS — J209 Acute bronchitis, unspecified: Secondary | ICD-10-CM

## 2018-08-19 MED ORDER — PREDNISONE 20 MG PO TABS: ORAL_TABLET | ORAL | 0 refills | Status: DC

## 2018-08-19 MED ORDER — DOXYCYCLINE HYCLATE 100 MG PO TABS: 100.0000 mg | ORAL_TABLET | Freq: Two times a day (BID) | ORAL | 0 refills | Status: DC

## 2018-08-19 NOTE — Progress Notes (Signed)
  Subjective:     Patient ID: Cindy Rangel, female   DOB: Jun 27, 1961, 58 y.o.   MRN: [id number] Chief Complaint  Patient presents with  . Sinus Problem    Patient comes in office today with concerns of sinus pain/pressure and cough for the past 3 weeks. Patient reports that cough is productive of sputum and states that she has been wheezing. Associated with sinus symptoms patient reports ringing and crackling in her right ear. Patient has tried otc Tyelnol Sinus, Mucinex and generic decongestant.    HPI States clear sinus congestion, watery eyes, and sinus pressure started a week ago. Has had cough productive of purulent sputum for 3 weeks. Denies dyspnea. She is currently on a monthly cigarette taper from 10 cigs/day in December.  Review of Systems     Objective:   Physical Exam Constitutional:      General: She is not in acute distress.    Appearance: She is not ill-appearing.  Neurological:     Mental Status: She is alert.   Ears: T.M's intact without inflammation Sinuses: non-tender Throat: no tonsillar enlargement or exudate Neck: no cervical adenopathy Lungs: Right posterior inspiratory/expiratory wheezes.     Assessment:    1. Acute bronchitis, unspecified organism - predniSONE (DELTASONE) 20 MG tablet; One pill twice daily for 5 days  Dispense: 10 tablet; Refill: 0 - doxycycline (VIBRA-TABS) 100 MG tablet; Take 1 tablet (100 mg total) by mouth 2 (two) times daily.  Dispense: 14 tablet; Refill: 0  2. Adult hypothyroidism - T4, free - TSH    Plan:    Further f/u pending lab work. Discussed use of Mucinex D for congestion.

## 2018-08-19 NOTE — Patient Instructions (Signed)
Discussed use of Mucinex D for congestion. We will call you with the lab results.

## 2018-08-20 ENCOUNTER — Telehealth: Payer: Self-pay

## 2018-08-20 ENCOUNTER — Other Ambulatory Visit: Payer: Self-pay | Admitting: Family Medicine

## 2018-08-20 DIAGNOSIS — E039 Hypothyroidism, unspecified: Secondary | ICD-10-CM

## 2018-08-20 LAB — T4, FREE: FREE T4: 1.23 ng/dL (ref 0.82–1.77)

## 2018-08-20 LAB — TSH: TSH: 5.44 u[IU]/mL — ABNORMAL HIGH (ref 0.450–4.500)

## 2018-08-20 MED ORDER — OSELTAMIVIR PHOSPHATE 75 MG PO CAPS: 75.0000 mg | ORAL_CAPSULE | Freq: Every day | ORAL | 0 refills | Status: DC

## 2018-08-20 MED ORDER — LEVOTHYROXINE SODIUM 88 MCG PO TABS: 88.0000 ug | ORAL_TABLET | Freq: Every day | ORAL | 3 refills | Status: DC

## 2018-08-20 NOTE — Telephone Encounter (Signed)
Patient advised, she states that she had her flu vaccine sometime in 03/2018.KW

## 2018-08-20 NOTE — Telephone Encounter (Signed)
-----   Message from Carmon Ginsberg, [place] sent at 08/20/2018  7:17 AM EST ----- Labs ok

## 2018-08-20 NOTE — Telephone Encounter (Signed)
I just saw her yesterday and initiated treatment for bronchitis. I have sent in the preventative dose of Tamiflu-one pill daily for 10 days. Has she had the flu shot this season?

## 2018-08-20 NOTE — Telephone Encounter (Signed)
Pt returned missed call. °Please call pt back, ° °Thanks, °TGH °

## 2018-08-20 NOTE — Telephone Encounter (Signed)
Patient advised. Patient states that she was watching grandchild over weekend and grandchild has tested positive for flu. Patient denies having any cold/flu like symptoms she is wanting to know if she should come in to be tested? KW

## 2018-08-20 NOTE — Telephone Encounter (Signed)
[id number] 

## 2018-08-27 ENCOUNTER — Telehealth: Payer: Self-pay | Admitting: Family Medicine

## 2018-08-27 NOTE — Telephone Encounter (Signed)
Pt calling to let Mikki Santee know she's not doing better after taking the medications prescribed.  Asking what else she can do?  Please advise.  Thanks, [employer]

## 2018-08-27 NOTE — Telephone Encounter (Signed)
Patient was seen in office on 2/10 and diagnosed with bronchitis, patient was prescribed doxy and prednisone. Since patient has completed course of medication and is not improving would you suggest that she return back to clinic?KW

## 2018-08-28 ENCOUNTER — Other Ambulatory Visit: Payer: Self-pay | Admitting: Family Medicine

## 2018-08-28 MED ORDER — CEFDINIR 300 MG PO CAPS: 300.0000 mg | ORAL_CAPSULE | Freq: Two times a day (BID) | ORAL | 0 refills | Status: DC

## 2018-08-28 NOTE — Telephone Encounter (Signed)
States bronchitis symptoms are unchanged after 7 days on doxycycline. Will start cefdinir 300 bid. She has minimized smoking while ill. Will continue Mucinex D and add Delsym.

## 2019-09-09 ENCOUNTER — Other Ambulatory Visit: Payer: Self-pay | Admitting: Physician Assistant

## 2019-09-09 DIAGNOSIS — E039 Hypothyroidism, unspecified: Secondary | ICD-10-CM

## 2019-09-09 NOTE — Telephone Encounter (Signed)
[facility] fax[facility] refill request for the following medications:   levothyroxine (SYNTHROID, LEVOTHROID) 88 MCG tablet   Please advise.  Thanks, [employer]

## 2019-09-09 NOTE — Telephone Encounter (Signed)
Tried calling patient and left voicemail message advising patient that she will need appointment. If patient calls back ok for PEC to schedule patient appointment.

## 2019-09-11 NOTE — Telephone Encounter (Signed)
Tired contacting patient again and no answer, [place].

## 2019-09-11 NOTE — Telephone Encounter (Signed)
Patient returned call and was schedule appointment for tomorrow,09/12/2019.

## 2019-09-12 ENCOUNTER — Other Ambulatory Visit: Payer: Self-pay

## 2019-09-12 ENCOUNTER — Encounter: Payer: Self-pay | Admitting: Physician Assistant

## 2019-09-12 ENCOUNTER — Ambulatory Visit (INDEPENDENT_AMBULATORY_CARE_PROVIDER_SITE_OTHER): Payer: BC Managed Care – PPO | Admitting: Physician Assistant

## 2019-09-12 VITALS — BP 118/78 | HR 89 | Temp 96.8°F | Ht 62.0 in | Wt 142.6 lb

## 2019-09-12 DIAGNOSIS — Z131 Encounter for screening for diabetes mellitus: Secondary | ICD-10-CM

## 2019-09-12 DIAGNOSIS — E7800 Pure hypercholesterolemia, unspecified: Secondary | ICD-10-CM

## 2019-09-12 DIAGNOSIS — Z1231 Encounter for screening mammogram for malignant neoplasm of breast: Secondary | ICD-10-CM | POA: Diagnosis not present

## 2019-09-12 DIAGNOSIS — Z1211 Encounter for screening for malignant neoplasm of colon: Secondary | ICD-10-CM

## 2019-09-12 DIAGNOSIS — E039 Hypothyroidism, unspecified: Secondary | ICD-10-CM

## 2019-09-12 NOTE — Patient Instructions (Signed)
Hypothyroidism  Hypothyroidism is when the thyroid gland does not make enough of certain hormones (it is underactive). The thyroid gland is a small gland located in the lower front part of the neck, just in front of the windpipe (trachea). This gland makes hormones that help control how the body uses food for energy (metabolism) as well as how the heart and brain function. These hormones also play a role in keeping your bones strong. When the thyroid is underactive, it produces too little of the hormones thyroxine (T4) and triiodothyronine (T3). What are the causes? This condition may be caused by:  Hashimoto's disease. This is a disease in which the body's disease-fighting system (immune system) attacks the thyroid gland. This is the most common cause.  Viral infections.  Pregnancy.  Certain medicines.  Birth defects.  Past radiation treatments to the head or neck for cancer.  Past treatment with radioactive iodine.  Past exposure to radiation in the environment.  Past surgical removal of part or all of the thyroid.  Problems with a gland in the center of the brain (pituitary gland).  Lack of enough iodine in the diet. What increases the risk? You are more likely to develop this condition if:  You are female.  You have a family history of thyroid conditions.  You use a medicine called lithium.  You take medicines that affect the immune system (immunosuppressants). What are the signs or symptoms? Symptoms of this condition include:  Feeling as though you have no energy (lethargy).  Not being able to tolerate cold.  Weight gain that is not explained by a change in diet or exercise habits.  Lack of appetite.  Dry skin.  Coarse hair.  Menstrual irregularity.  Slowing of thought processes.  Constipation.  Sadness or depression. How is this diagnosed? This condition may be diagnosed based on:  Your symptoms, your medical history, and a physical exam.  Blood  tests. You may also have imaging tests, such as an ultrasound or MRI. How is this treated? This condition is treated with medicine that replaces the thyroid hormones that your body does not make. After you begin treatment, it may take several weeks for symptoms to go away. Follow these instructions at home:  Take over-the-counter and prescription medicines only as told by your health care provider.  If you start taking any new medicines, tell your health care provider.  Keep all follow-up visits as told by your health care provider. This is important. ? As your condition improves, your dosage of thyroid hormone medicine may change. ? You will need to have blood tests regularly so that your health care provider can monitor your condition. Contact a health care provider if:  Your symptoms do not get better with treatment.  You are taking thyroid replacement medicine and you: ? Sweat a lot. ? Have tremors. ? Feel anxious. ? Lose weight rapidly. ? Cannot tolerate heat. ? Have emotional swings. ? Have diarrhea. ? Feel weak. Get help right away if you have:  Chest pain.  An irregular heartbeat.  A rapid heartbeat.  Difficulty breathing. Summary  Hypothyroidism is when the thyroid gland does not make enough of certain hormones (it is underactive).  When the thyroid is underactive, it produces too little of the hormones thyroxine (T4) and triiodothyronine (T3).  The most common cause is Hashimoto's disease, a disease in which the body's disease-fighting system (immune system) attacks the thyroid gland. The condition can also be caused by viral infections, medicine, pregnancy, or past   radiation treatment to the head or neck.  Symptoms may include weight gain, dry skin, constipation, feeling as though you do not have energy, and not being able to tolerate cold.  This condition is treated with medicine to replace the thyroid hormones that your body does not make. This information  is not intended to replace advice given to you by your health care provider. Make sure you discuss any questions you have with your health care provider. Document Revised: 06/08/2017 Document Reviewed: 06/06/2017 Elsevier Patient Education  2020 Elsevier Inc.  

## 2019-09-12 NOTE — Progress Notes (Signed)
Patient: Cindy Rangel Female    DOB: March 30, 1961   59 y.o.   MRN: [id number] Visit Date: 09/12/2019  Today's Provider: Trinna Post, PA-C   Chief Complaint  Patient presents with  . Hypothyroidism  . Hyperlipidemia   Subjective:     HPI  Hypothyroid, follow-up:  TSH  Date Value Ref Range Status  08/19/2018 5.440 (H) 0.450 - 4.500 uIU/mL Final  08/13/2017 2.450 0.450 - 4.500 uIU/mL Final  03/10/2015 3.160 0.450 - 4.500 uIU/mL Final   Wt Readings from Last 3 Encounters:  09/12/19 142 lb 9.6 oz (64.7 kg)  08/19/18 148 lb 9.6 oz (67.4 kg)  02/11/18 140 lb (63.5 kg)    She was last seen for hypothyroid 12 months ago.  Management since that visit includes synthroid 88 mcg dail. She reports good compliance with treatment. She is not having side effects.  She is exercising. She is experiencing none She denies change in energy level, diarrhea, heat / cold intolerance, nervousness and palpitations Weight trend: stable  ------------------------------------------------------------------------   Lipid/Cholesterol, Follow-up:   Last seen for this12 months ago.  Management changes since that visit include none. . Last Lipid Panel:    Component Value Date/Time   CHOL 230 (H) 03/22/2017 0946   TRIG 93 03/22/2017 0946   HDL 79 03/22/2017 0946   CHOLHDL 2.9 03/22/2017 0946   LDLCALC 132 (H) 03/22/2017 0946    Risk factors for vascular disease include hypercholesterolemia  She reports good compliance with treatment. She is not having side effects.  Current symptoms include none and have been stable. Weight trend: stable Prior visit with dietician: no Current diet: well balanced Current exercise: walking  Wt Readings from Last 3 Encounters:  09/12/19 142 lb 9.6 oz (64.7 kg)  08/19/18 148 lb 9.6 oz (67.4 kg)  02/11/18 140 lb (63.5 kg)   She got second dose of Pfizer vaccine two weeks   Dad had colon polyps.    -------------------------------------------------------------------     No Known Allergies   Current Outpatient Medications:  .  amoxicillin (AMOXIL) 500 MG capsule, Take by mouth 4 (four) times daily., Disp: , Rfl:  .  folic acid (FOLVITE) 1 MG tablet, Take 1 mg by mouth daily., Disp: , Rfl:  .  levothyroxine (SYNTHROID, LEVOTHROID) 88 MCG tablet, Take 1 tablet (88 mcg total) by mouth daily., Disp: 90 tablet, Rfl: 3 .  Multiple Vitamin (MULTIVITAMIN) capsule, Take by mouth., Disp: , Rfl:  .  Vitamin D, Ergocalciferol, (DRISDOL) 50000 units CAPS capsule, Take by mouth., Disp: , Rfl:  .  cefdinir (OMNICEF) 300 MG capsule, Take 1 capsule (300 mg total) by mouth 2 (two) times daily. (Patient not taking: Reported on 09/12/2019), Disp: 20 capsule, Rfl: 0 .  Cholecalciferol (VITAMIN D) 2000 units CAPS, Take 1 capsule by mouth daily., Disp: , Rfl:  .  fluticasone (FLONASE) 50 MCG/ACT nasal spray, Place 2 sprays into both nostrils daily. (Patient not taking: Reported on 08/19/2018), Disp: 16 g, Rfl: 5 .  Omega-3 Fatty Acids (FISH OIL) 1200 MG CAPS, Take by mouth., Disp: , Rfl:  .  oseltamivir (TAMIFLU) 75 MG capsule, Take 1 capsule (75 mg total) by mouth daily. (Patient not taking: Reported on 09/12/2019), Disp: 10 capsule, Rfl: 0 .  predniSONE (DELTASONE) 20 MG tablet, One pill twice daily for 5 days (Patient not taking: Reported on 09/12/2019), Disp: 10 tablet, Rfl: 0  Review of Systems  Social History   Tobacco Use  . Smoking status: Current  Every Day Smoker    Packs/day: 0.50    Types: Cigarettes  . Smokeless tobacco: Never Used  Substance Use Topics  . Alcohol use: Yes    Alcohol/week: 0.0 standard drinks    Comment: rare      Objective:   BP 118/78 (BP Location: Right Arm, Patient Position: Sitting, Cuff Size: Normal)   Pulse 89   Temp (!) 96.8 F (36 C) (Temporal)   Wt 142 lb 9.6 oz (64.7 kg)   SpO2 99%   BMI 26.08 kg/m  Vitals:   09/12/19 0846  BP: 118/78  Pulse: 89   Temp: (!) 96.8 F (36 C)  TempSrc: Temporal  SpO2: 99%  Weight: 142 lb 9.6 oz (64.7 kg)  Body mass index is 26.08 kg/m.   Physical Exam Constitutional:      Appearance: Normal appearance.  Cardiovascular:     Rate and Rhythm: Normal rate and regular rhythm.     Heart sounds: Normal heart sounds.  Pulmonary:     Effort: Pulmonary effort is normal.     Breath sounds: Normal breath sounds.  Neurological:     Mental Status: She is alert and oriented to person, place, and time. Mental status is at baseline.  Psychiatric:        Mood and Affect: Mood normal.        Behavior: Behavior normal.      No results found for any visits on 09/12/19.     Assessment & Plan    1. Colon cancer screening  Follow up in one month for CPE and PAP.  - Ambulatory referral to Gastroenterology  2. Encounter for screening mammogram for malignant neoplasm of breast  - MM DIGITAL SCREENING BILATERAL; Future  3. Hypercholesteremia  - CBC with Differential/Platelet - Comprehensive metabolic panel - Lipid panel  4. Adult hypothyroidism  Adjust synthroid pending labs.   - TSH  5. Diabetes mellitus screening  - Hemoglobin A1c  The entirety of the information documented in the History of Present Illness, Review of Systems and Physical Exam were personally obtained by me. Portions of this information were initially documented by [facility] and reviewed by me for thoroughness and accuracy.      Trinna Post, PA-C  [facility]

## 2019-09-13 LAB — COMPREHENSIVE METABOLIC PANEL
ALT: 11 IU/L (ref 0–32)
AST: 20 IU/L (ref 0–40)
Albumin/Globulin Ratio: 1.6 (ref 1.2–2.2)
Albumin: 4.2 g/dL (ref 3.8–4.9)
Alkaline Phosphatase: 97 IU/L (ref 39–117)
BUN/Creatinine Ratio: 30 — ABNORMAL HIGH (ref 9–23)
BUN: 24 mg/dL (ref 6–24)
Bilirubin Total: 0.2 mg/dL (ref 0.0–1.2)
CO2: 25 mmol/L (ref 20–29)
Calcium: 9.9 mg/dL (ref 8.7–10.2)
Chloride: 102 mmol/L (ref 96–106)
Creatinine, Ser: 0.8 mg/dL (ref 0.57–1.00)
GFR calc Af Amer: 95 mL/min/{1.73_m2} (ref >59)
GFR calc non Af Amer: 82 mL/min/{1.73_m2} (ref >59)
Globulin, Total: 2.6 g/dL (ref 1.5–4.5)
Glucose: 83 mg/dL (ref 65–99)
Potassium: 4.4 mmol/L (ref 3.5–5.2)
Sodium: 139 mmol/L (ref 134–144)
Total Protein: 6.8 g/dL (ref 6.0–8.5)

## 2019-09-13 LAB — CBC WITH DIFFERENTIAL/PLATELET
Basophils Absolute: 0 10*3/uL (ref 0.0–0.2)
Basos: 1 %
EOS (ABSOLUTE): 0.1 10*3/uL (ref 0.0–0.4)
Eos: 2 %
Hematocrit: 38.4 % (ref 34.0–46.6)
Hemoglobin: 13.3 g/dL (ref 11.1–15.9)
Immature Grans (Abs): 0 10*3/uL (ref 0.0–0.1)
Immature Granulocytes: 0 %
Lymphocytes Absolute: 2.1 10*3/uL (ref 0.7–3.1)
Lymphs: 47 %
MCH: 31.1 pg (ref 26.6–33.0)
MCHC: 34.6 g/dL (ref 31.5–35.7)
MCV: 90 fL (ref 79–97)
Monocytes Absolute: 0.5 10*3/uL (ref 0.1–0.9)
Monocytes: 10 %
Neutrophils Absolute: 1.8 10*3/uL (ref 1.4–7.0)
Neutrophils: 40 %
Platelets: 298 10*3/uL (ref 150–450)
RBC: 4.28 x10E6/uL (ref 3.77–5.28)
RDW: 12.2 % (ref 11.7–15.4)
WBC: 4.6 10*3/uL (ref 3.4–10.8)

## 2019-09-13 LAB — TSH: TSH: 3.32 u[IU]/mL (ref 0.450–4.500)

## 2019-09-13 LAB — LIPID PANEL
Chol/HDL Ratio: 3.7 ratio (ref 0.0–4.4)
Cholesterol, Total: 259 mg/dL — ABNORMAL HIGH (ref 100–199)
HDL: 70 mg/dL (ref >39)
LDL Chol Calc (NIH): 177 mg/dL — ABNORMAL HIGH (ref 0–99)
Triglycerides: 73 mg/dL (ref 0–149)
VLDL Cholesterol Cal: 12 mg/dL (ref 5–40)

## 2019-09-13 LAB — HEMOGLOBIN A1C
Est. average glucose Bld gHb Est-mCnc: 111 mg/dL
Hgb A1c MFr Bld: 5.5 % (ref 4.8–5.6)

## 2019-09-26 ENCOUNTER — Encounter: Payer: Self-pay | Admitting: Physician Assistant

## 2019-09-26 ENCOUNTER — Ambulatory Visit (INDEPENDENT_AMBULATORY_CARE_PROVIDER_SITE_OTHER): Payer: BC Managed Care – PPO | Admitting: Physician Assistant

## 2019-09-26 ENCOUNTER — Other Ambulatory Visit (HOSPITAL_COMMUNITY)
Admission: RE | Admit: 2019-09-26 | Discharge: 2019-09-26 | Disposition: A | Discharge status: Home / Self Care | Payer: BC Managed Care – PPO | Source: Ambulatory Visit | Attending: Physician Assistant | Admitting: Physician Assistant

## 2019-09-26 ENCOUNTER — Other Ambulatory Visit: Payer: Self-pay

## 2019-09-26 VITALS — BP 118/66 | HR 88 | Temp 96.8°F | Ht 63.0 in | Wt 139.8 lb

## 2019-09-26 DIAGNOSIS — Z1231 Encounter for screening mammogram for malignant neoplasm of breast: Secondary | ICD-10-CM

## 2019-09-26 DIAGNOSIS — Z1211 Encounter for screening for malignant neoplasm of colon: Secondary | ICD-10-CM

## 2019-09-26 DIAGNOSIS — Z0000 Encounter for general adult medical examination without abnormal findings: Secondary | ICD-10-CM | POA: Insufficient documentation

## 2019-09-26 DIAGNOSIS — Z124 Encounter for screening for malignant neoplasm of cervix: Secondary | ICD-10-CM | POA: Diagnosis not present

## 2019-09-26 DIAGNOSIS — Z23 Encounter for immunization: Secondary | ICD-10-CM

## 2019-09-26 NOTE — Progress Notes (Signed)
Patient: Cindy Rangel, Female    DOB: [birth year], 59 y.o.   MRN: [id number] Visit Date: 09/26/2019  Today's Provider: Trinna Post, PA-C   Chief Complaint  Patient presents with  . Annual Exam   Subjective:    Annual physical exam Cindy Rangel is a 59 y.o. female who presents today for health maintenance and complete physical. She feels well. She reports exercising none. She reports she is sleeping well.  Mammogram:07/19/2018 normal Colonoscopy: referred 09/2019 PAP smear: due, no history abnormals. -----------------------------------------------------------------    Review of Systems  Constitutional: Negative.   HENT: Positive for dental problem.   Eyes: Negative.   Respiratory: Negative.   Cardiovascular: Negative.   Gastrointestinal: Negative.   Endocrine: Negative.   Genitourinary: Negative.   Musculoskeletal: Positive for back pain.       Right shoulder pain per pt.  Skin: Negative.   Allergic/Immunologic: Negative.   Neurological: Negative.   Hematological: Negative.   Psychiatric/Behavioral: Negative.     Social History She  reports that she has quit smoking. Her smoking use included cigarettes. She smoked 0.00 packs per day. She has never used smokeless tobacco. She reports current alcohol use. She reports that she does not use drugs. Social History   Socioeconomic History  . Marital status: Married    Spouse name: Not on file  . Number of children: Not on file  . Years of education: Not on file  . Highest education level: Not on file  Occupational History  . Not on file  Tobacco Use  . Smoking status: Former Smoker    Packs/day: 0.00    Types: Cigarettes  . Smokeless tobacco: Never Used  Substance and Sexual Activity  . Alcohol use: Yes    Alcohol/week: 0.0 standard drinks    Comment: rare  . Drug use: No  . Sexual activity: Yes    Partners: Male    Birth control/protection: None  Other Topics Concern  . Not on file  Social  History Narrative  . Not on file   Social Determinants of Health   Financial Resource Strain:   . Difficulty of Paying Living Expenses:   Food Insecurity:   . Worried About Charity fundraiser in the Last Year:   . Arboriculturist in the Last Year:   Transportation Needs:   . Film/video editor (Medical):   Marland Kitchen Lack of Transportation (Non-Medical):   Physical Activity:   . Days of Exercise per Week:   . Minutes of Exercise per Session:   Stress:   . Feeling of Stress :   Social Connections:   . Frequency of Communication with Friends and Family:   . Frequency of Social Gatherings with Friends and Family:   . Attends Religious Services:   . Active Member of Clubs or Organizations:   . Attends Archivist Meetings:   Marland Kitchen Marital Status:     Patient Active Problem List   Diagnosis Date Noted  . History of vitamin D deficiency 03/02/2017  . Allergic rhinitis 03/09/2015  . Hypercholesteremia 03/09/2015  . Adult hypothyroidism 03/09/2015  . Tobacco use 03/09/2015    Past Surgical History:  Procedure Laterality Date  . APPENDECTOMY    . CESAREAN SECTION    . FASCIOTOMY  1983   history of due to compartment syndrome    Family History  Family Status  Relation Name Status  . Mother  Alive  . Father  Deceased at age 33  kidney failure and CHF  . Sister  Alive  . Brother  Alive  . Sister  Alive  . Sister  Alive  . Brother  Deceased  . GChild  Alive   Her family history includes Breast cancer (age of onset: 27) in her mother; Cancer in her father; Cancer (age of onset: 48) in her mother; Celiac disease in her brother; Cerebral palsy in her brother; Colon polyps in her father; Congestive Heart Failure in her father; Diabetes in her brother and father; Epilepsy in her brother; Healthy in her grandchild, sister, and sister; Hypertension in her father and mother; Hypothyroidism in her mother; Kidney disease in her father; Lupus in her sister.     No Known  Allergies  Previous Medications   AMOXICILLIN (AMOXIL) 500 MG CAPSULE    Take by mouth 4 (four) times daily.   CHOLECALCIFEROL (VITAMIN D) 2000 UNITS CAPS    Take 1 capsule by mouth daily.   FOLIC ACID (FOLVITE) 1 MG TABLET    Take 1 mg by mouth daily.   LEVOTHYROXINE (SYNTHROID, LEVOTHROID) 88 MCG TABLET    Take 1 tablet (88 mcg total) by mouth daily.   MULTIPLE VITAMIN (MULTIVITAMIN) CAPSULE    Take by mouth.   OMEGA-3 FATTY ACIDS (FISH OIL) 1200 MG CAPS    Take by mouth.   VITAMIN D, ERGOCALCIFEROL, (DRISDOL) 50000 UNITS CAPS CAPSULE    Take by mouth.    Patient Care Team: Paulene Floor as PCP - General (Physician Assistant)      Objective:   Vitals: BP 118/66 (BP Location: Left Arm, Patient Position: Sitting, Cuff Size: Normal)   Pulse 88   Temp (!) 96.8 F (36 C) (Temporal)   Ht 5\' 3"  (1.6 m)   Wt 139 lb 12.8 oz (63.4 kg)   SpO2 98%   BMI 24.76 kg/m    Physical Exam Constitutional:      Appearance: Normal appearance.  Cardiovascular:     Rate and Rhythm: Normal rate and regular rhythm.     Heart sounds: Normal heart sounds.  Pulmonary:     Effort: Pulmonary effort is normal.     Breath sounds: Normal breath sounds.  Chest:     Breasts:        Right: Normal.        Left: Normal.  Abdominal:     General: Bowel sounds are normal.     Palpations: Abdomen is soft.  Genitourinary:    General: Normal vulva.     Exam position: Lithotomy position.     Labia:        Right: No rash.        Left: No rash.      Vagina: Normal.     Cervix: Normal.     Uterus: Normal.      Adnexa: Right adnexa normal.  Skin:    General: Skin is warm and dry.  Neurological:     Mental Status: She is alert and oriented to person, place, and time. Mental status is at baseline.  Psychiatric:        Mood and Affect: Mood normal.        Behavior: Behavior normal.      Depression Screen PHQ 2/9 Scores 09/26/2019 09/12/2019 09/12/2019 03/02/2017  PHQ - 2 Score 0 0 0 0  PHQ- 9  Score 0 - 0 -      Assessment & Plan:     Routine Health Maintenance and Physical Exam  Exercise Activities  and Dietary recommendations Goals   None     Immunization History  Administered Date(s) Administered  . Influenza Split 07/13/2010, 06/18/2013  . Influenza,inj,Quad PF,6+ Mos 04/19/2016, 03/02/2017  . Pneumococcal Polysaccharide-23 03/02/2017  . Tdap 03/26/2009  . Zoster Recombinat (Shingrix) 03/02/2017, 08/02/2017    Health Maintenance  Topic Date Due  . HIV Screening  Never done  . PAP SMEAR-Modifier  Never done  . COLONOSCOPY  Never done  . TETANUS/TDAP  03/27/2019  . INFLUENZA VACCINE  10/08/2019 (Originally 02/08/2019)  . MAMMOGRAM  07/19/2020  . Hepatitis C Screening  Completed     Discussed health benefits of physical activity, and encouraged her to engage in regular exercise appropriate for her age and condition.    1. Annual physical exam  - Cytology - PAP - Tdap vaccine greater than or equal to 7yo IM  2. Cervical cancer screening  - Cytology - PAP  3. Encounter for screening mammogram for malignant neoplasm of breast   4. Colon cancer screening   5. Need for tetanus, diphtheria, and acellular pertussis (Tdap) vaccine  - Tdap vaccine greater than or equal to 7yo IM  The entirety of the information documented in the History of Present Illness, Review of Systems and Physical Exam were personally obtained by me. Portions of this information were initially documented by [facility] and reviewed by me for thoroughness and accuracy.   F/u 1 year CPE --------------------------------------------------------------------

## 2019-09-30 LAB — CYTOLOGY - PAP
Comment: NEGATIVE
Diagnosis: NEGATIVE
High risk HPV: NEGATIVE

## 2019-10-02 ENCOUNTER — Telehealth: Payer: Self-pay

## 2019-10-02 NOTE — Telephone Encounter (Signed)
-----   Message from Mar Daring, [place] sent at 10/02/2019 10:24 AM EDT ----- Pap is normal, HPV negative.  Will repeat in 5 years.

## 2019-10-02 NOTE — Telephone Encounter (Signed)
Left message advising pt.  (Per DPR)  Thanks,   -Lakysha Kossman  

## 2019-10-07 ENCOUNTER — Telehealth (INDEPENDENT_AMBULATORY_CARE_PROVIDER_SITE_OTHER): Payer: Self-pay | Admitting: Gastroenterology

## 2019-10-07 VITALS — Ht 63.0 in | Wt 139.0 lb

## 2019-10-07 DIAGNOSIS — Z1211 Encounter for screening for malignant neoplasm of colon: Secondary | ICD-10-CM

## 2019-10-07 MED ORDER — PEG 3350-KCL-NA BICARB-NACL 420 G PO SOLR: 4000.0000 mL | Freq: Once | ORAL | 0 refills | Status: AC

## 2019-10-07 NOTE — Progress Notes (Signed)
Gastroenterology Pre-Procedure Review  Request Date: Friday 10/31/19 Requesting Physician: Dr. Vicente Males  PATIENT REVIEW QUESTIONS: The patient responded to the following health history questions as indicated:    1. Are you having any GI issues? No 2. Do you have a personal history of Polyps? No 3. Do you have a family history of Colon Cancer or Polyps?yes father colon polyps 4. Diabetes Mellitus?no 5. Joint replacements in the past 12 months?no 6. Major health problems in the past 3 months?no 7. Any artificial heart valves, MVP, or defibrillator?no    MEDICATIONS & ALLERGIES:    Patient reports the following regarding taking any anticoagulation/antiplatelet therapy:   Plavix, Coumadin, Eliquis, Xarelto, Lovenox, Pradaxa, Brilinta, or Effient? no Aspirin? no  Patient confirms/reports the following medications:  Current Outpatient Medications  Medication Sig Dispense Refill  . amoxicillin (AMOXIL) 500 MG capsule Take by mouth 4 (four) times daily.    . chlorhexidine (PERIDEX) 0.12 % solution SMARTSIG:0.5 Ounce(s) By Mouth Twice Daily    . Cholecalciferol (VITAMIN D) 2000 units CAPS Take 1 capsule by mouth daily.    . folic acid (FOLVITE) 1 MG tablet Take 1 mg by mouth daily.    Marland Kitchen levothyroxine (SYNTHROID, LEVOTHROID) 88 MCG tablet Take 1 tablet (88 mcg total) by mouth daily. 90 tablet 3  . Multiple Vitamin (MULTIVITAMIN) capsule Take by mouth.    . naproxen (NAPROSYN) 250 MG tablet PLEASE SEE ATTACHED FOR DETAILED DIRECTIONS    . Omega-3 Fatty Acids (FISH OIL) 1200 MG CAPS Take by mouth.    . Vitamin D, Ergocalciferol, (DRISDOL) 50000 units CAPS capsule Take by mouth.     No current facility-administered medications for this visit.    Patient confirms/reports the following allergies:  No Known Allergies  No orders of the defined types were placed in this encounter.   AUTHORIZATION INFORMATION Primary Insurance: 1D#: Group #:  Secondary Insurance: 1D#: Group #:  SCHEDULE  INFORMATION: Date:  Time: Location:

## 2019-10-07 NOTE — Patient Instructions (Signed)
Good morning Cindy Rangel,  Thank you for allowing me to schedule your colonoscopy.  We've scheduled your Colonoscopy at [facility] with Dr. Jonathon Bellows on Friday 10/31/19.  Please call the endoscopy dept on Thursday 04/22 at 1pm for arrival time.  The phone number is [phone].  COVID Test [facility] Wed 04/21 arrive between 8am-1pm.  Please review your colonoscopy instructions located in the letters section of mychart.  If you have any questions, please call.  Have a great day!  [name], [place]

## 2019-10-14 ENCOUNTER — Ambulatory Visit
Admission: RE | Admit: 2019-10-14 | Discharge: 2019-10-14 | Disposition: A | Discharge status: Home / Self Care | Payer: BC Managed Care – PPO | Source: Ambulatory Visit | Attending: Physician Assistant | Admitting: Physician Assistant

## 2019-10-14 DIAGNOSIS — Z1231 Encounter for screening mammogram for malignant neoplasm of breast: Secondary | ICD-10-CM | POA: Insufficient documentation

## 2019-10-29 ENCOUNTER — Other Ambulatory Visit
Admission: RE | Admit: 2019-10-29 | Discharge: 2019-10-29 | Disposition: A | Discharge status: Home / Self Care | Payer: BC Managed Care – PPO | Source: Ambulatory Visit | Attending: Gastroenterology | Admitting: Gastroenterology

## 2019-10-29 ENCOUNTER — Other Ambulatory Visit: Payer: Self-pay

## 2019-10-29 DIAGNOSIS — Z20822 Contact with and (suspected) exposure to covid-19: Secondary | ICD-10-CM | POA: Diagnosis not present

## 2019-10-29 DIAGNOSIS — Z01812 Encounter for preprocedural laboratory examination: Secondary | ICD-10-CM | POA: Diagnosis not present

## 2019-10-29 LAB — SARS CORONAVIRUS 2 (TAT 6-24 HRS): SARS Coronavirus 2: NEGATIVE

## 2019-10-31 ENCOUNTER — Ambulatory Visit: Payer: BC Managed Care – PPO | Admitting: Anesthesiology

## 2019-10-31 ENCOUNTER — Other Ambulatory Visit: Payer: Self-pay

## 2019-10-31 ENCOUNTER — Encounter: Payer: Self-pay | Admitting: Gastroenterology

## 2019-10-31 ENCOUNTER — Ambulatory Visit
Admission: RE | Admit: 2019-10-31 | Discharge: 2019-10-31 | Disposition: A | Discharge status: Home / Self Care | Payer: BC Managed Care – PPO | Attending: Gastroenterology | Admitting: Gastroenterology

## 2019-10-31 ENCOUNTER — Encounter
Admission: RE | Disposition: A | Discharge status: Home / Self Care | Payer: Self-pay | Source: Home / Self Care | Attending: Gastroenterology

## 2019-10-31 DIAGNOSIS — D122 Benign neoplasm of ascending colon: Secondary | ICD-10-CM | POA: Diagnosis not present

## 2019-10-31 DIAGNOSIS — E039 Hypothyroidism, unspecified: Secondary | ICD-10-CM | POA: Diagnosis not present

## 2019-10-31 DIAGNOSIS — Z79890 Hormone replacement therapy (postmenopausal): Secondary | ICD-10-CM | POA: Insufficient documentation

## 2019-10-31 DIAGNOSIS — E559 Vitamin D deficiency, unspecified: Secondary | ICD-10-CM | POA: Insufficient documentation

## 2019-10-31 DIAGNOSIS — E669 Obesity, unspecified: Secondary | ICD-10-CM | POA: Diagnosis not present

## 2019-10-31 DIAGNOSIS — E7800 Pure hypercholesterolemia, unspecified: Secondary | ICD-10-CM | POA: Insufficient documentation

## 2019-10-31 DIAGNOSIS — Z87891 Personal history of nicotine dependence: Secondary | ICD-10-CM | POA: Diagnosis not present

## 2019-10-31 DIAGNOSIS — Z6824 Body mass index (BMI) 24.0-24.9, adult: Secondary | ICD-10-CM | POA: Diagnosis not present

## 2019-10-31 DIAGNOSIS — Z8371 Family history of colonic polyps: Secondary | ICD-10-CM | POA: Insufficient documentation

## 2019-10-31 DIAGNOSIS — Z1211 Encounter for screening for malignant neoplasm of colon: Secondary | ICD-10-CM | POA: Diagnosis not present

## 2019-10-31 HISTORY — PX: COLONOSCOPY WITH PROPOFOL: SHX5780

## 2019-10-31 SURGERY — COLONOSCOPY WITH PROPOFOL
Anesthesia: General

## 2019-10-31 MED ORDER — LIDOCAINE HCL (PF) 1 % IJ SOLN
INTRAMUSCULAR | Status: AC
  Administered 2019-10-31: 1 mL
  Filled 2019-10-31: qty 2

## 2019-10-31 MED ORDER — PROPOFOL 10 MG/ML IV BOLUS
INTRAVENOUS | Status: DC | PRN
  Administered 2019-10-31: 60 mg via INTRAVENOUS

## 2019-10-31 MED ORDER — PROPOFOL 500 MG/50ML IV EMUL
INTRAVENOUS | Status: DC | PRN
  Administered 2019-10-31: 175 ug/kg/min via INTRAVENOUS

## 2019-10-31 MED ORDER — SODIUM CHLORIDE 0.9 % IV SOLN: INTRAVENOUS | Status: DC

## 2019-10-31 NOTE — Transfer of Care (Signed)
Immediate Anesthesia Transfer of Care Note  Patient: Cindy Rangel  Procedure(s) Performed: COLONOSCOPY WITH PROPOFOL (N/A )  Patient Location: PACU  Anesthesia Type:General  Level of Consciousness: sedated  Airway & Oxygen Therapy: Patient Spontanous Breathing and Patient connected to nasal cannula oxygen  Post-op Assessment: Report given to RN and Post -op Vital signs reviewed and stable  Post vital signs: Reviewed and stable  Last Vitals:  Vitals Value Taken Time  BP 100/59 10/31/19 1050  Temp 35.7 C 10/31/19 1050  Pulse 69 10/31/19 1052  Resp 13 10/31/19 1052  SpO2 100 % 10/31/19 1052  Vitals shown include unvalidated device data.  Last Pain:  Vitals:   10/31/19 1050  TempSrc: Temporal  PainSc: Asleep         Complications: No apparent anesthesia complications

## 2019-10-31 NOTE — Op Note (Signed)
[facility] Patient Name: Cindy Rangel Procedure Date: 10/31/2019 10:21 AM MRN: [id number] Account #: [id number] Date of Birth: 01/01/1961 Admit Type: Outpatient Age: 59 Room: [facility] ENDO ROOM 2 Gender: Female Note Status: Finalized Procedure:             Colonoscopy Indications:           Colon cancer screening in patient at increased risk:                         Family history of 1st-degree relative with colon polyps Providers:             Jonathon Bellows MD, MD Medicines:             Monitored Anesthesia Care Complications:         No immediate complications. Procedure:             Pre-Anesthesia Assessment:                        - Prior to the procedure, a History and Physical was                         performed, and patient medications, allergies and                         sensitivities were reviewed. The patient's tolerance                         of previous anesthesia was reviewed.                        - The risks and benefits of the procedure and the                         sedation options and risks were discussed with the                         patient. All questions were answered and informed                         consent was obtained.                        - ASA Grade Assessment: II - A patient with mild                         systemic disease.                        After obtaining informed consent, the colonoscope was                         passed under direct vision. Throughout the procedure,                         the patient's blood pressure, pulse, and oxygen                         saturations were monitored continuously. The  Colonoscope was introduced through the anus and                         advanced to the the cecum, identified by the                         appendiceal orifice. The colonoscopy was performed                         with ease. The patient tolerated the procedure well.                    The quality of the bowel preparation was excellent. Findings:      The perianal and digital rectal examinations were normal.      A 3 mm polyp was found in the proximal ascending colon. The polyp was       sessile. The polyp was removed with a cold biopsy forceps. Resection and       retrieval were complete.      The exam was otherwise without abnormality on direct and retroflexion       views. Impression:            - One 3 mm polyp in the proximal ascending colon,                         removed with a cold biopsy forceps. Resected and                         retrieved.                        - The examination was otherwise normal on direct and                         retroflexion views. Recommendation:        - Discharge patient to home (with escort).                        - Resume previous diet.                        - Continue present medications.                        - Await pathology results.                        - Repeat colonoscopy in 5 years for surveillance. Procedure Code(s):     --- Professional ---                        [phone], Colonoscopy, flexible; with biopsy, single or                         multiple Diagnosis Code(s):     --- Professional ---                        Z83.71, Family history of colonic polyps                        K63.5,  Polyp of colon CPT copyright 2019 American Medical Association. All rights reserved. The codes documented in this report are preliminary and upon coder review may  be revised to meet current compliance requirements. Jonathon Bellows, MD Jonathon Bellows MD, MD 10/31/2019 10:48:44 AM This report has been signed electronically. Number of Addenda: 0 Note Initiated On: 10/31/2019 10:21 AM Scope Withdrawal Time: 0 hours 11 minutes 58 seconds  Total Procedure Duration: 0 hours 15 minutes 0 seconds  Estimated Blood Loss:  Estimated blood loss: none.      [facility]

## 2019-10-31 NOTE — Anesthesia Postprocedure Evaluation (Signed)
Anesthesia Post Note  Patient: Cindy Rangel  Procedure(s) Performed: COLONOSCOPY WITH PROPOFOL (N/A )  Patient location during evaluation: Endoscopy Anesthesia Type: General Level of consciousness: awake and alert and oriented Pain management: pain level controlled Vital Signs Assessment: post-procedure vital signs reviewed and stable Respiratory status: spontaneous breathing, nonlabored ventilation and respiratory function stable Cardiovascular status: blood pressure returned to baseline and stable Postop Assessment: no signs of nausea or vomiting Anesthetic complications: no     Last Vitals:  Vitals:   10/31/19 1100 10/31/19 1110  BP: 122/76 123/78  Pulse: 71 60  Resp: 13 16  Temp:    SpO2: 100% 100%    Last Pain:  Vitals:   10/31/19 1110  TempSrc:   PainSc: 0-No pain                 Penelopi Mikrut

## 2019-10-31 NOTE — Anesthesia Preprocedure Evaluation (Signed)
Anesthesia Evaluation  Patient identified by MRN, date of birth, ID band Patient awake    Reviewed: Allergy & Precautions, NPO status , Patient's Chart, lab work & pertinent test results  History of Anesthesia Complications Negative for: history of anesthetic complications  Airway Mallampati: II  TM Distance: >3 FB Neck ROM: Full    Dental  (+) Missing   Pulmonary neg sleep apnea, neg COPD, former smoker,    breath sounds clear to auscultation- rhonchi (-) wheezing      Cardiovascular Exercise Tolerance: Good (-) hypertension(-) CAD, (-) Past MI, (-) Cardiac Stents and (-) CABG  Rhythm:Regular Rate:Normal - Systolic murmurs and - Diastolic murmurs    Neuro/Psych neg Seizures negative neurological ROS  negative psych ROS   GI/Hepatic negative GI ROS, Neg liver ROS,   Endo/Other  neg diabetesHypothyroidism   Renal/GU negative Renal ROS     Musculoskeletal negative musculoskeletal ROS (+)   Abdominal (+) - obese,   Peds  Hematology negative hematology ROS (+)   Anesthesia Other Findings Past Medical History: No date: Allergic rhinitis No date: Hypercholesterolemia No date: Thyroid disease     Comment:  hypothyroidism  No date: Vitamin D deficiency   Reproductive/Obstetrics                             Anesthesia Physical Anesthesia Plan  ASA: II  Anesthesia Plan: General   Post-op Pain Management:    Induction: Intravenous  PONV Risk Score and Plan: 2 and Propofol infusion  Airway Management Planned: Natural Airway  Additional Equipment:   Intra-op Plan:   Post-operative Plan:   Informed Consent: I have reviewed the patients History and Physical, chart, labs and discussed the procedure including the risks, benefits and alternatives for the proposed anesthesia with the patient or authorized representative who has indicated his/her understanding and acceptance.     Dental  advisory given  Plan Discussed with: CRNA and Anesthesiologist  Anesthesia Plan Comments:         Anesthesia Quick Evaluation

## 2019-10-31 NOTE — Anesthesia Procedure Notes (Signed)
Date/Time: 10/31/2019 10:35 AM Performed by: Nelda Marseille, CRNA Pre-anesthesia Checklist: Patient identified, Emergency Drugs available, Suction available, Patient being monitored and Timeout performed Oxygen Delivery Method: Nasal cannula

## 2019-10-31 NOTE — H&P (Signed)
Jonathon Bellows, MD [address] [place], [place], [place], [address] [address], [place], [place], [place], [address] Phone: [phone]  Fax: [phone]  Primary Care Physician:  Trinna Post, PA-C   Pre-Procedure History & Physical: HPI:  Cindy Rangel is a 59 y.o. female is here for an colonoscopy.   Past Medical History:  Diagnosis Date  . Allergic rhinitis   . Hypercholesterolemia   . Thyroid disease    hypothyroidism   . Vitamin D deficiency     Past Surgical History:  Procedure Laterality Date  . APPENDECTOMY    . CESAREAN SECTION    . FASCIOTOMY  1983   history of due to compartment syndrome    Prior to Admission medications   Medication Sig Start Date End Date Taking? Authorizing Provider  levothyroxine (SYNTHROID, LEVOTHROID) 88 MCG tablet Take 1 tablet (88 mcg total) by mouth daily. 08/20/18  Yes Carmon Ginsberg, PA  Cholecalciferol (VITAMIN D) 2000 units CAPS Take 1 capsule by mouth daily.    [provider]  folic acid (FOLVITE) 1 MG tablet Take 1 mg by mouth daily.    [provider]  Multiple Vitamin (MULTIVITAMIN) capsule Take by mouth.    [provider]  Vitamin D, Ergocalciferol, (DRISDOL) 50000 units CAPS capsule Take by mouth.    [provider]    Allergies as of 10/07/2019  . (No Known Allergies)    Family History  Problem Relation Age of Onset  . Hypertension Mother   . Cancer Mother 64       breast  . Hypothyroidism Mother   . Breast cancer Mother 28  . Cancer Father        skin  . Hypertension Father   . Diabetes Father        type 2  . Kidney disease Father   . Congestive Heart Failure Father   . Colon polyps Father   . Lupus Sister   . Diabetes Brother   . Healthy Sister   . Healthy Sister   . Epilepsy Brother   . Celiac disease Brother   . Cerebral palsy Brother   . Healthy Grandchild     Social History   Socioeconomic History  . Marital status: Married    Spouse  name: Not on file  . Number of children: Not on file  . Years of education: Not on file  . Highest education level: Not on file  Occupational History  . Not on file  Tobacco Use  . Smoking status: Former Smoker    Packs/day: 0.00    Types: Cigarettes  . Smokeless tobacco: Never Used  Substance and Sexual Activity  . Alcohol use: Yes    Alcohol/week: 0.0 standard drinks    Comment: rare  . Drug use: No  . Sexual activity: Yes    Partners: Male    Birth control/protection: None  Other Topics Concern  . Not on file  Social History Narrative  . Not on file   Social Determinants of Health   Financial Resource Strain:   . Difficulty of Paying Living Expenses:   Food Insecurity:   . Worried About Charity fundraiser in the Last Year:   . Arboriculturist in the Last Year:   Transportation Needs:   . Film/video editor (Medical):   Marland Kitchen Lack of Transportation (Non-Medical):   Physical Activity:   . Days of Exercise per Week:   . Minutes of Exercise per  Session:   Stress:   . Feeling of Stress :   Social Connections:   . Frequency of Communication with Friends and Family:   . Frequency of Social Gatherings with Friends and Family:   . Attends Religious Services:   . Active Member of Clubs or Organizations:   . Attends Archivist Meetings:   Marland Kitchen Marital Status:   Intimate Partner Violence:   . Fear of Current or Ex-Partner:   . Emotionally Abused:   Marland Kitchen Physically Abused:   . Sexually Abused:     Review of Systems: See HPI, otherwise negative ROS  Physical Exam: BP 124/82   Pulse 74   Temp (!) 96.3 F (35.7 C) (Temporal)   Resp 16   Ht 5\' 3"  (1.6 m)   Wt 63 kg   SpO2 100%   BMI 24.62 kg/m  General:   Alert,  pleasant and cooperative in NAD Head:  Normocephalic and atraumatic. Neck:  Supple; no masses or thyromegaly. Lungs:  Clear throughout to auscultation, normal respiratory effort.    Heart:  +S1, +S2, Regular rate and rhythm, No edema. Abdomen:   Soft, nontender and nondistended. Normal bowel sounds, without guarding, and without rebound.   Neurologic:  Alert and  oriented x4;  grossly normal neurologically.  Impression/Plan: Cindy Rangel is here for an colonoscopy to be performed for Screening colonoscopy family history of colon polyps Risks, benefits, limitations, and alternatives regarding  colonoscopy have been reviewed with the patient.  Questions have been answered.  All parties agreeable.   Jonathon Bellows, MD  10/31/2019, 10:04 AM

## 2019-11-02 NOTE — Progress Notes (Signed)
Voicemail.  No Message left. 

## 2019-11-03 LAB — SURGICAL PATHOLOGY

## 2019-11-06 ENCOUNTER — Encounter: Payer: Self-pay | Admitting: Gastroenterology

## 2020-01-21 ENCOUNTER — Other Ambulatory Visit: Payer: Self-pay | Admitting: Physician Assistant

## 2020-01-21 DIAGNOSIS — E039 Hypothyroidism, unspecified: Secondary | ICD-10-CM

## 2020-04-09 NOTE — Progress Notes (Signed)
MyChart Video Visit    Virtual Visit via Video Note   This visit type was conducted due to national recommendations for restrictions regarding the COVID-19 Pandemic (e.g. social distancing) in an effort to limit this patient's exposure and mitigate transmission in our community. This patient is at least at moderate risk for complications without adequate follow up. This format is felt to be most appropriate for this patient at this time. Physical exam was limited by quality of the video and audio technology used for the visit.   Patient location: Home Provider location: Office   I discussed the limitations of evaluation and management by telemedicine and the availability of in person appointments. The patient expressed understanding and agreed to proceed.  Patient: Cindy Rangel   DOB: 06-28-1961   59 y.o. Female  MRN: [id number] Visit Date: 04/12/2020  Today's healthcare provider: Trinna Post, PA-C   Chief Complaint  Patient presents with  . Cough   Subjective    Cough    Patient presents today with four weeks of a cough, PND, and wheezing. She denies fevers. She has some SOB when she is coughing. She did not get tested for COVID during this time. She has been vaccinated against COVID - last dose 08/2019. She denies myalgias, vomiting.   Patient reports a knot on her left wrist that has been there for a year. No numbness or tingling. This came about slowly over time. It is not painful.   She reports her left shoulder is painful. She reports the pain radiates down into her upper arm. She reports history of [employer]. She reports sleeping on the arm is painful. She has trouble with back pocket motions. She has trouble abducting her arms.     Medications: Outpatient Medications Prior to Visit  Medication Sig  . Cholecalciferol (VITAMIN D) 2000 units CAPS Take 1 capsule by mouth daily.  . folic acid (FOLVITE) 1 MG tablet Take 1 mg by mouth daily.  Marland Kitchen levothyroxine  (SYNTHROID) 88 MCG tablet TAKE 1 TABLET BY MOUTH EVERY DAY  . Multiple Vitamin (MULTIVITAMIN) capsule Take by mouth.  . Vitamin D, Ergocalciferol, (DRISDOL) 50000 units CAPS capsule Take by mouth.   No facility-administered medications prior to visit.    Review of Systems  Respiratory: Positive for cough.       Objective    There were no vitals taken for this visit.   Physical Exam Constitutional:      Appearance: Normal appearance.  Pulmonary:     Effort: Pulmonary effort is normal. No respiratory distress.  Neurological:     Mental Status: She is alert.  Psychiatric:        Mood and Affect: Mood normal.        Behavior: Behavior normal.        Assessment & Plan    1. Bronchitis  - predniSONE (DELTASONE) 20 MG tablet; Take 1 tablet (20 mg total) by mouth 2 (two) times daily with a meal for 5 days.  Dispense: 10 tablet; Refill: 0 - albuterol (VENTOLIN HFA) 108 (90 Base) MCG/ACT inhaler; Inhale 2 puffs into the lungs every 6 (six) hours as needed for wheezing or shortness of breath.  Dispense: 1 each; Refill: 2 - HYDROcodone-homatropine (HYCODAN) 5-1.5 MG/5ML syrup; Take 5 mLs by mouth at bedtime.  Dispense: 120 mL; Refill: 0  2. Ganglion cyst of wrist, left  Advised on generally benign nature of this however intervention is typically surgical. Offered orthopedic referral, patient defers at this  time but may consider in the future.   3. Impingement syndrome of left shoulder  Start with xray. Can schedule for steroid injection next week.   - DG Shoulder Left; Future    Return in about 1 week (around 04/19/2020) for subacromial bursitis injection .     I discussed the assessment and treatment plan with the patient. The patient was provided an opportunity to ask questions and all were answered. The patient agreed with the plan and demonstrated an understanding of the instructions.   The patient was advised to call back or seek an in-person evaluation if the  symptoms worsen or if the condition fails to improve as anticipated.   ITrinna Post, PA-C, have reviewed all documentation for this visit. The documentation on 04/13/20 for the exam, diagnosis, procedures, and orders are all accurate and complete.  The entirety of the information documented in the History of Present Illness, Review of Systems and Physical Exam were personally obtained by me. Portions of this information were initially documented by Wilburt Finlay, CMA and reviewed by me for thoroughness and accuracy.    Paulene Floor [facility] [phone] (phone) [phone] (fax)  [place]

## 2020-04-12 ENCOUNTER — Telehealth (INDEPENDENT_AMBULATORY_CARE_PROVIDER_SITE_OTHER): Payer: BC Managed Care – PPO | Admitting: Physician Assistant

## 2020-04-12 DIAGNOSIS — M7542 Impingement syndrome of left shoulder: Secondary | ICD-10-CM

## 2020-04-12 DIAGNOSIS — J40 Bronchitis, not specified as acute or chronic: Secondary | ICD-10-CM

## 2020-04-12 DIAGNOSIS — M67432 Ganglion, left wrist: Secondary | ICD-10-CM | POA: Diagnosis not present

## 2020-04-12 MED ORDER — ALBUTEROL SULFATE HFA 108 (90 BASE) MCG/ACT IN AERS: 2.0000 | INHALATION_SPRAY | Freq: Four times a day (QID) | RESPIRATORY_TRACT | 2 refills | Status: AC | PRN

## 2020-04-12 MED ORDER — HYDROCODONE-HOMATROPINE 5-1.5 MG/5ML PO SYRP: 5.0000 mL | ORAL_SOLUTION | Freq: Every day | ORAL | 0 refills | Status: DC

## 2020-04-12 MED ORDER — PREDNISONE 20 MG PO TABS: 20.0000 mg | ORAL_TABLET | Freq: Two times a day (BID) | ORAL | 0 refills | Status: AC

## 2020-04-20 ENCOUNTER — Encounter: Payer: Self-pay | Admitting: Physician Assistant

## 2020-04-21 ENCOUNTER — Ambulatory Visit: Payer: Self-pay | Admitting: Physician Assistant

## 2020-10-26 ENCOUNTER — Other Ambulatory Visit: Payer: Self-pay | Admitting: Physician Assistant

## 2020-10-26 DIAGNOSIS — E039 Hypothyroidism, unspecified: Secondary | ICD-10-CM

## 2020-10-27 NOTE — Telephone Encounter (Signed)
Requested medication (s) are due for refill today: yes   Requested medication (s) are on the active medication list: yes   Last refill: 07/22/2020  Future visit scheduled: no  Notes to clinic: overdue for labs and appt Message has been sent to patient to contact office    Requested Prescriptions  Pending Prescriptions Disp Refills   levothyroxine (SYNTHROID) 88 MCG tablet [Pharmacy Med Name: LEVOTHYROXINE 88 MCG TABLET] 90 tablet 2    Sig: TAKE 1 TABLET BY MOUTH EVERY DAY      Endocrinology:  Hypothyroid Agents Failed - 10/26/2020  1:20 AM      Failed - TSH needs to be rechecked within 3 months after an abnormal result. Refill until TSH is due.      Failed - TSH in normal range and within 360 days    TSH  Date Value Ref Range Status  09/12/2019 3.320 0.450 - 4.500 uIU/mL Final          Passed - Valid encounter within last 12 months    Recent Outpatient Visits           6 months ago [facility] [place], Wendee Bea[place], [place]   1 year ago Annual physical exam   [facility] Trinna Post, [place]   1 year ago Colon cancer screening   [place], [place], [place]   2 years ago Acute bronchitis, unspecified organism   [place], [place]   2 years ago Chronic midline thoracic back pain   [place], [place], [place]

## 2020-11-30 ENCOUNTER — Other Ambulatory Visit: Payer: Self-pay | Admitting: Family Medicine

## 2020-11-30 DIAGNOSIS — E039 Hypothyroidism, unspecified: Secondary | ICD-10-CM

## 2020-11-30 NOTE — Telephone Encounter (Signed)
Requested medication (s) are due for refill today: Yes  Requested medication (s) are on the active medication list: Yes  Last refill:  10/27/20  Future visit scheduled: No  Notes to clinic:  Left message to call and make appointment.    Requested Prescriptions  Pending Prescriptions Disp Refills   levothyroxine (SYNTHROID) 88 MCG tablet [Pharmacy Med Name: LEVOTHYROXINE 88 MCG TABLET] 30 tablet 0    Sig: TAKE 1 TABLET BY MOUTH EVERY DAY      Endocrinology:  Hypothyroid Agents Failed - 11/30/2020  8:33 AM      Failed - TSH needs to be rechecked within 3 months after an abnormal result. Refill until TSH is due.      Failed - TSH in normal range and within 360 days    TSH  Date Value Ref Range Status  09/12/2019 3.320 0.450 - 4.500 uIU/mL Final          Passed - Valid encounter within last 12 months    Recent Outpatient Visits           7 months ago [place], Wendee Bea[place], [place]   1 year ago Annual physical exam   [facility] Trinna Post, [place]   1 year ago Colon cancer screening   [place], [place], [place]   2 years ago Acute bronchitis, unspecified organism   [place], [place]   2 years ago Chronic midline thoracic back pain   [place], [place], [place]

## 2021-01-08 ENCOUNTER — Other Ambulatory Visit: Payer: Self-pay | Admitting: Family Medicine

## 2021-01-08 DIAGNOSIS — E039 Hypothyroidism, unspecified: Secondary | ICD-10-CM

## 2021-01-08 NOTE — Telephone Encounter (Signed)
Pt needs appt for any further refills per previous note on prescription. Last RF 11/30/20 #30 Courtesy refill. Message sent to pt via MyChart to make appt.  Requested Prescriptions  Pending Prescriptions Disp Refills   levothyroxine (SYNTHROID) 88 MCG tablet [Pharmacy Med Name: LEVOTHYROXINE 88 MCG TABLET] 30 tablet 0    Sig: TAKE 1 TABLET (88 MCG TOTAL) BY MOUTH DAILY. PLEASE SCHEDULE AN OFFICE VISIT BEFORE ANYMORE REFILLS.      Endocrinology:  Hypothyroid Agents Failed - 01/08/2021  9:07 AM      Failed - TSH needs to be rechecked within 3 months after an abnormal result. Refill until TSH is due.      Failed - TSH in normal range and within 360 days    TSH  Date Value Ref Range Status  09/12/2019 3.320 0.450 - 4.500 uIU/mL Final          Passed - Valid encounter within last 12 months    Recent Outpatient Visits           9 months ago [place], Wendee Bea[place], [place]   1 year ago Annual physical exam   [facility] Trinna Post, [place]   1 year ago Colon cancer screening   [place], [place], [place]   2 years ago Acute bronchitis, unspecified organism   [place], [place]   2 years ago Chronic midline thoracic back pain   [place], [place], [place]

## 2021-02-10 ENCOUNTER — Other Ambulatory Visit: Payer: Self-pay | Admitting: Family Medicine

## 2021-02-10 DIAGNOSIS — E039 Hypothyroidism, unspecified: Secondary | ICD-10-CM

## 2021-02-10 NOTE — Telephone Encounter (Signed)
Requested medications are due for refill today.  yes  Requested medications are on the active medications list.  yes  Last refill. 01/09/2021  Future visit scheduled.   no  Notes to clinic.  PCP listed is Carles Collet.

## 2021-04-29 ENCOUNTER — Telehealth: Payer: Self-pay | Admitting: *Deleted

## 2021-04-29 NOTE — Telephone Encounter (Signed)
Copied from [place] [id number]. Topic: General - Other >> Apr 29, 2021  2:03 PM Tessa Lerner A wrote: Reason for CRM: The patient has been rescheduled for their physical   Their new appointment is scheduled for 07/25/21 at 2:00 PM  The patient would like to know what they should do about their medications between now and their appointment  The patient is concerned with continuing to receive their thyroid medication specifically   Please contact to further discuss

## 2021-04-29 NOTE — Telephone Encounter (Signed)
Please advise 

## 2021-05-03 ENCOUNTER — Encounter: Payer: BC Managed Care – PPO | Admitting: Family Medicine

## 2021-05-23 ENCOUNTER — Telehealth: Payer: Self-pay

## 2021-05-23 DIAGNOSIS — E039 Hypothyroidism, unspecified: Secondary | ICD-10-CM

## 2021-05-23 NOTE — Telephone Encounter (Signed)
Patient  needs refill on Levothyroxine to last till her CPE appt. On 06/16/21 send to [facility] in [place].

## 2021-05-24 MED ORDER — LEVOTHYROXINE SODIUM 88 MCG PO TABS: 88.0000 ug | ORAL_TABLET | Freq: Every day | ORAL | 0 refills | Status: DC

## 2021-05-24 NOTE — Addendum Note (Signed)
Addended by: Minette Headland on: 05/24/2021 09:32 AM   Modules accepted: Orders

## 2021-06-16 ENCOUNTER — Encounter: Payer: Self-pay | Admitting: Family Medicine

## 2021-06-16 ENCOUNTER — Other Ambulatory Visit: Payer: Self-pay

## 2021-06-16 ENCOUNTER — Ambulatory Visit (INDEPENDENT_AMBULATORY_CARE_PROVIDER_SITE_OTHER): Payer: BC Managed Care – PPO | Admitting: Family Medicine

## 2021-06-16 ENCOUNTER — Other Ambulatory Visit: Payer: Self-pay | Admitting: Family Medicine

## 2021-06-16 VITALS — BP 128/73 | HR 72 | Resp 16 | Ht 63.0 in | Wt 161.5 lb

## 2021-06-16 DIAGNOSIS — Z114 Encounter for screening for human immunodeficiency virus [HIV]: Secondary | ICD-10-CM | POA: Diagnosis not present

## 2021-06-16 DIAGNOSIS — Z0000 Encounter for general adult medical examination without abnormal findings: Secondary | ICD-10-CM

## 2021-06-16 DIAGNOSIS — E538 Deficiency of other specified B group vitamins: Secondary | ICD-10-CM

## 2021-06-16 DIAGNOSIS — R799 Abnormal finding of blood chemistry, unspecified: Secondary | ICD-10-CM

## 2021-06-16 DIAGNOSIS — Z23 Encounter for immunization: Secondary | ICD-10-CM

## 2021-06-16 DIAGNOSIS — E039 Hypothyroidism, unspecified: Secondary | ICD-10-CM

## 2021-06-16 DIAGNOSIS — E7800 Pure hypercholesterolemia, unspecified: Secondary | ICD-10-CM | POA: Insufficient documentation

## 2021-06-16 DIAGNOSIS — Z8262 Family history of osteoporosis: Secondary | ICD-10-CM

## 2021-06-16 DIAGNOSIS — L989 Disorder of the skin and subcutaneous tissue, unspecified: Secondary | ICD-10-CM

## 2021-06-16 DIAGNOSIS — Z8639 Personal history of other endocrine, nutritional and metabolic disease: Secondary | ICD-10-CM

## 2021-06-16 DIAGNOSIS — Z131 Encounter for screening for diabetes mellitus: Secondary | ICD-10-CM

## 2021-06-16 NOTE — Assessment & Plan Note (Signed)
Low risk screen °

## 2021-06-16 NOTE — Assessment & Plan Note (Signed)
Request for b12 labs; denies any concerns

## 2021-06-16 NOTE — Assessment & Plan Note (Signed)
Repeat chem panel

## 2021-06-16 NOTE — Assessment & Plan Note (Signed)
DEXA scan with mammogram

## 2021-06-16 NOTE — Assessment & Plan Note (Signed)
Request vit d labs; denies fatigue

## 2021-06-16 NOTE — Assessment & Plan Note (Signed)
Recommend increase in diet of healthier fat choices- low fat meats, oils that are not solid at room temperature, nuts, seeds, fish- cod, halibut, salmon, and avocado. Exercise can also increase this number.  Supplemental omega 3's can be taken as well but are not as helpful as dietary/exercise changes.  

## 2021-06-16 NOTE — Assessment & Plan Note (Signed)
OW- elevated serum glucose

## 2021-06-16 NOTE — Progress Notes (Signed)
Complete physical exam   Patient: Cindy Rangel   DOB: 1960-08-24   60 y.o. Female  MRN: [id number] Visit Date: 06/16/2021  Today's healthcare provider: Gwyneth Sprout, FNP   Chief Complaint  Patient presents with   Annual Exam   Subjective     HPI  Cindy Rangel is a 60 y.o. female who presents today for a complete physical exam.  She reports consuming a general diet. The patient does not participate in regular exercise at present. She generally feels fairly well. She reports sleeping well. She does not have additional problems to discuss today.   Last Reported Pap-09/26/2019 Mammo-10/14/2019 Colonoscopy-10/31/2019  Past Medical History:  Diagnosis Date   Allergic rhinitis    Hypercholesterolemia    Thyroid disease    hypothyroidism    Vitamin D deficiency    Past Surgical History:  Procedure Laterality Date   APPENDECTOMY     CESAREAN SECTION     COLONOSCOPY WITH PROPOFOL N/A 10/31/2019   Procedure: COLONOSCOPY WITH PROPOFOL;  Surgeon: Jonathon Bellows, MD;  Location: [facility] ENDOSCOPY;  Service: Gastroenterology;  Laterality: N/A;   FASCIOTOMY  1983   history of due to compartment syndrome   Social History   Socioeconomic History   Marital status: Married    Spouse name: Not on file   Number of children: Not on file   Years of education: Not on file   Highest education level: Not on file  Occupational History   Not on file  Tobacco Use   Smoking status: Former    Packs/day: 0.00    Types: Cigarettes   Smokeless tobacco: Never  Vaping Use   Vaping Use: Never used  Substance and Sexual Activity   Alcohol use: Yes    Alcohol/week: 0.0 standard drinks    Comment: rare   Drug use: No   Sexual activity: Yes    Partners: Male    Birth control/protection: None  Other Topics Concern   Not on file  Social History Narrative   Not on file   Social Determinants of Health   Financial Resource Strain: Not on file  Food Insecurity: Not on file  Transportation  Needs: Not on file  Physical Activity: Not on file  Stress: Not on file  Social Connections: Not on file  Intimate Partner Violence: Not on file   Family Status  Relation Name Status   Mother  Alive   Father  Deceased at age 67       kidney failure and CHF   Sister  Alive   Brother  Alive   Sister  Alive   Sister  Alive   Brother  Deceased   Kennedy  Alive   Family History  Problem Relation Age of Onset   Hypertension Mother    Cancer Mother 58       breast   Hypothyroidism Mother    Breast cancer Mother 57   Cancer Father        skin   Hypertension Father    Diabetes Father        type 2   Kidney disease Father    Congestive Heart Failure Father    Colon polyps Father    Lupus Sister    Diabetes Brother    Healthy Sister    Healthy Sister    Epilepsy Brother    Celiac disease Brother    Cerebral palsy Brother    Healthy Grandchild    No Known Allergies  Patient Care Team:  Gwyneth Sprout, FNP as PCP - General (Family Medicine)   Medications: Outpatient Medications Prior to Visit  Medication Sig   albuterol (VENTOLIN HFA) 108 (90 Base) MCG/ACT inhaler Inhale 2 puffs into the lungs every 6 (six) hours as needed for wheezing or shortness of breath.   Cholecalciferol (VITAMIN D) 2000 units CAPS Take 1 capsule by mouth daily.   folic acid (FOLVITE) 1 MG tablet Take 1 mg by mouth daily.   levothyroxine (SYNTHROID) 88 MCG tablet TAKE 1 TABLET BY MOUTH EVERY DAY   Multiple Vitamin (MULTIVITAMIN) capsule Take by mouth.   Vitamin D, Ergocalciferol, (DRISDOL) 50000 units CAPS capsule Take by mouth.   [DISCONTINUED] HYDROcodone-homatropine (HYCODAN) 5-1.5 MG/5ML syrup Take 5 mLs by mouth at bedtime.   No facility-administered medications prior to visit.    Review of Systems  HENT:  Positive for postnasal drip and rhinorrhea.   Respiratory:  Positive for cough.   Cardiovascular:  Positive for leg swelling.  Musculoskeletal:  Positive for back pain.  All other  systems reviewed and are negative.    Objective    BP 128/73   Pulse 72   Resp 16   Ht 5\' 3"  (1.6 m)   Wt 161 lb 8 oz (73.3 kg)   SpO2 100%   BMI 28.61 kg/m    Physical Exam Vitals and nursing note reviewed.  Constitutional:      General: She is awake. She is not in acute distress.    Appearance: Normal appearance. She is well-developed, well-groomed and overweight. She is not ill-appearing, toxic-appearing or diaphoretic.  HENT:     Head: Normocephalic and atraumatic.     Jaw: There is normal jaw occlusion. No trismus, tenderness, swelling or pain on movement.     Right Ear: Hearing, tympanic membrane, ear canal and external ear normal. There is no impacted cerumen.     Left Ear: Hearing, tympanic membrane, ear canal and external ear normal. There is no impacted cerumen.     Nose: Nose normal. No congestion or rhinorrhea.     Right Turbinates: Not enlarged, swollen or pale.     Left Turbinates: Not enlarged, swollen or pale.     Right Sinus: No maxillary sinus tenderness or frontal sinus tenderness.     Left Sinus: No maxillary sinus tenderness or frontal sinus tenderness.     Mouth/Throat:     Lips: Pink.     Mouth: Mucous membranes are moist. No injury.     Tongue: No lesions.     Pharynx: Oropharynx is clear. Uvula midline. No pharyngeal swelling, oropharyngeal exudate, posterior oropharyngeal erythema or uvula swelling.     Tonsils: No tonsillar exudate or tonsillar abscesses.  Eyes:     General: Lids are normal. Lids are everted, no foreign bodies appreciated. Vision grossly intact. Gaze aligned appropriately. No allergic shiner or visual field deficit.       Right eye: No discharge.        Left eye: No discharge.     Extraocular Movements: Extraocular movements intact.     Conjunctiva/sclera: Conjunctivae normal.     Right eye: Right conjunctiva is not injected. No exudate.    Left eye: Left conjunctiva is not injected. No exudate.    Pupils: Pupils are equal,  round, and reactive to light.  Neck:     Thyroid: No thyroid mass, thyromegaly or thyroid tenderness.     Vascular: No carotid bruit.     Trachea: Trachea normal.  Cardiovascular:  Rate and Rhythm: Normal rate and regular rhythm.     Pulses: Normal pulses.          Carotid pulses are 2+ on the right side and 2+ on the left side.      Radial pulses are 2+ on the right side and 2+ on the left side.       Dorsalis pedis pulses are 2+ on the right side and 2+ on the left side.       Posterior tibial pulses are 2+ on the right side and 2+ on the left side.     Heart sounds: Normal heart sounds, S1 normal and S2 normal. No murmur heard.   No friction rub. No gallop.  Pulmonary:     Effort: Pulmonary effort is normal. No respiratory distress.     Breath sounds: Normal breath sounds and air entry. No stridor. No wheezing, rhonchi or rales.  Chest:     Chest wall: No tenderness.     Comments: Breast exam deferred; discussed self exam Abdominal:     General: Abdomen is flat. Bowel sounds are normal. There is no distension.     Palpations: Abdomen is soft. There is no mass.     Tenderness: There is no abdominal tenderness. There is no right CVA tenderness, left CVA tenderness, guarding or rebound.     Hernia: No hernia is present.  Genitourinary:    Comments: Exam deferred; denies complaints Musculoskeletal:        General: No swelling, tenderness, deformity or signs of injury. Normal range of motion.     Cervical back: Full passive range of motion without pain, normal range of motion and neck supple. No edema, rigidity or tenderness. No muscular tenderness.     Right lower leg: No edema.     Left lower leg: No edema.  Lymphadenopathy:     Cervical: No cervical adenopathy.     Right cervical: No superficial, deep or posterior cervical adenopathy.    Left cervical: No superficial, deep or posterior cervical adenopathy.  Skin:    General: Skin is warm and dry.     Capillary Refill:  Capillary refill takes less than 2 seconds.     Coloration: Skin is not jaundiced or pale.     Findings: No bruising, erythema, lesion or rash.  Neurological:     General: No focal deficit present.     Mental Status: She is alert and oriented to person, place, and time. Mental status is at baseline.     GCS: GCS eye subscore is 4. GCS verbal subscore is 5. GCS motor subscore is 6.     Sensory: Sensation is intact. No sensory deficit.     Motor: Motor function is intact. No weakness.     Coordination: Coordination is intact. Coordination normal.     Gait: Gait is intact. Gait normal.  Psychiatric:        Attention and Perception: Attention and perception normal.        Mood and Affect: Mood and affect normal.        Speech: Speech normal.        Behavior: Behavior normal. Behavior is cooperative.        Thought Content: Thought content normal.        Cognition and Memory: Cognition and memory normal.        Judgment: Judgment normal.     Last depression screening scores PHQ 2/9 Scores 06/16/2021 09/26/2019 09/12/2019  PHQ - 2 Score 0 0  0  PHQ- 9 Score 0 0 -   Last fall risk screening Fall Risk  09/26/2019  Falls in the past year? 0  Number falls in past yr: 0  Injury with Fall? 0   Last Audit-C alcohol use screening Alcohol Use Disorder Test (AUDIT) 06/16/2021  1. How often do you have a drink containing alcohol? 0  2. How many drinks containing alcohol do you have on a typical day when you are drinking? 0  3. How often do you have six or more drinks on one occasion? 0  AUDIT-C Score 0   A score of 3 or more in women, and 4 or more in men indicates increased risk for alcohol abuse, EXCEPT if all of the points are from question 1   No results found for any visits on 06/16/21.  Assessment & Plan    Routine Health Maintenance and Physical Exam  Exercise Activities and Dietary recommendations  Goals   None     Immunization History  Administered Date(s) Administered    Influenza Split 07/13/2010, 06/18/2013   Influenza,inj,Quad PF,6+ Mos 04/19/2016, 03/02/2017, 06/16/2021   PFIZER(Purple Top)SARS-COV-2 Vaccination 08/09/2019, 08/30/2019, 07/05/2020   Pneumococcal Polysaccharide-23 03/02/2017   Tdap 03/26/2009, 09/26/2019   Zoster Recombinat (Shingrix) 03/02/2017, 08/02/2017    Health Maintenance  Topic Date Due   COVID-19 Vaccine (4 - Booster for Pfizer series) 08/30/2020   MAMMOGRAM  10/13/2021   PAP SMEAR-Modifier  09/25/2024   COLONOSCOPY (Pts 45-54yrs Insurance coverage will need to be confirmed)  10/30/2024   TETANUS/TDAP  09/25/2029   INFLUENZA VACCINE  Completed   Hepatitis C Screening  Completed   HIV Screening  Completed   Zoster Vaccines- Shingrix  Completed   Pneumococcal Vaccine 69-82 Years old  Aged Out   HPV VACCINES  Aged Out    Discussed health benefits of physical activity, and encouraged her to engage in regular exercise appropriate for her age and condition.     Return in about 1 year (around 06/16/2022) for annual examination.     Vonna Kotyk, FNP, have reviewed all documentation for this visit. The documentation on 06/16/21 for the exam, diagnosis, procedures, and orders are all accurate and complete.    Gwyneth Sprout, [place] [phone] (phone) [phone] (fax)  [place]

## 2021-06-16 NOTE — Assessment & Plan Note (Signed)
New occurrence Referral placed  0.6 cm round, skin color, occ painful

## 2021-06-16 NOTE — Patient Instructions (Signed)
Recommend use of flonase/zyrtec for nasal

## 2021-06-16 NOTE — Assessment & Plan Note (Signed)
Repeat blood work recommend diet low in saturated fat and regular exercise - 30 min at least 5 times per week

## 2021-06-16 NOTE — Assessment & Plan Note (Signed)
Stable on medication Denies any fatigue outside of work Repeat labs

## 2021-06-16 NOTE — Assessment & Plan Note (Signed)
Provided

## 2021-06-16 NOTE — Assessment & Plan Note (Signed)

## 2021-06-17 ENCOUNTER — Other Ambulatory Visit: Payer: Self-pay | Admitting: Family Medicine

## 2021-06-17 ENCOUNTER — Encounter: Payer: Self-pay | Admitting: Family Medicine

## 2021-06-17 DIAGNOSIS — E559 Vitamin D deficiency, unspecified: Secondary | ICD-10-CM

## 2021-06-17 DIAGNOSIS — E039 Hypothyroidism, unspecified: Secondary | ICD-10-CM

## 2021-06-17 LAB — LIPID PANEL
Chol/HDL Ratio: 3.7 ratio (ref 0.0–4.4)
Cholesterol, Total: 271 mg/dL — ABNORMAL HIGH (ref 100–199)
HDL: 74 mg/dL (ref >39)
LDL Chol Calc (NIH): 165 mg/dL — ABNORMAL HIGH (ref 0–99)
Triglycerides: 180 mg/dL — ABNORMAL HIGH (ref 0–149)
VLDL Cholesterol Cal: 32 mg/dL (ref 5–40)

## 2021-06-17 LAB — TSH+FREE T4
Free T4: 1.17 ng/dL (ref 0.82–1.77)
TSH: 5.21 u[IU]/mL — ABNORMAL HIGH (ref 0.450–4.500)

## 2021-06-17 LAB — COMPREHENSIVE METABOLIC PANEL
ALT: 47 IU/L — ABNORMAL HIGH (ref 0–32)
AST: 45 IU/L — ABNORMAL HIGH (ref 0–40)
Albumin/Globulin Ratio: 1.7 (ref 1.2–2.2)
Albumin: 4.2 g/dL (ref 3.8–4.9)
Alkaline Phosphatase: 136 IU/L — ABNORMAL HIGH (ref 44–121)
BUN/Creatinine Ratio: 40 — ABNORMAL HIGH (ref 9–23)
BUN: 27 mg/dL — ABNORMAL HIGH (ref 6–24)
Bilirubin Total: <0.2 mg/dL (ref 0.0–1.2)
CO2: 25 mmol/L (ref 20–29)
Calcium: 9.5 mg/dL (ref 8.7–10.2)
Chloride: 106 mmol/L (ref 96–106)
Creatinine, Ser: 0.67 mg/dL (ref 0.57–1.00)
Globulin, Total: 2.5 g/dL (ref 1.5–4.5)
Glucose: 94 mg/dL (ref 70–99)
Potassium: 4.4 mmol/L (ref 3.5–5.2)
Sodium: 144 mmol/L (ref 134–144)
Total Protein: 6.7 g/dL (ref 6.0–8.5)
eGFR: 101 mL/min/{1.73_m2} (ref >59)

## 2021-06-17 LAB — VITAMIN B12: Vitamin B-12: 582 pg/mL (ref 232–1245)

## 2021-06-17 LAB — HEMOGLOBIN A1C
Est. average glucose Bld gHb Est-mCnc: 111 mg/dL
Hgb A1c MFr Bld: 5.5 % (ref 4.8–5.6)

## 2021-06-17 LAB — HIV ANTIBODY (ROUTINE TESTING W REFLEX): HIV Screen 4th Generation wRfx: NONREACTIVE

## 2021-06-17 LAB — VITAMIN D 25 HYDROXY (VIT D DEFICIENCY, FRACTURES): Vit D, 25-Hydroxy: 24.1 ng/mL — ABNORMAL LOW (ref 30.0–100.0)

## 2021-06-17 MED ORDER — VITAMIN D (ERGOCALCIFEROL) 1.25 MG (50000 UNIT) PO CAPS: 50000.0000 [IU] | ORAL_CAPSULE | ORAL | 3 refills | Status: DC

## 2021-06-17 MED ORDER — LEVOTHYROXINE SODIUM 100 MCG PO TABS: 100.0000 ug | ORAL_TABLET | Freq: Every day | ORAL | 0 refills | Status: DC

## 2021-07-14 ENCOUNTER — Other Ambulatory Visit: Payer: Self-pay | Admitting: Family Medicine

## 2021-07-14 DIAGNOSIS — E039 Hypothyroidism, unspecified: Secondary | ICD-10-CM

## 2021-08-22 ENCOUNTER — Other Ambulatory Visit: Payer: Self-pay | Admitting: Family Medicine

## 2021-08-22 DIAGNOSIS — E039 Hypothyroidism, unspecified: Secondary | ICD-10-CM

## 2021-08-23 NOTE — Telephone Encounter (Signed)
Refusing rx due to dose inconsistent with current med list.  Requested Prescriptions  Pending Prescriptions Disp Refills   levothyroxine (SYNTHROID) 88 MCG tablet [Pharmacy Med Name: LEVOTHYROXINE 88 MCG TABLET] 30 tablet 0    Sig: TAKE 1 [place]     Endocrinology:  Hypothyroid Agents Failed - 08/22/2021 10:44 AM      Failed - TSH in normal range and within 360 days    TSH  Date Value Ref Range Status  06/16/2021 5.210 (H) 0.450 - 4.500 uIU/mL Final         Passed - Valid encounter within last 12 months    Recent Outpatient Visits          2 months ago Annual physical exam   [facility] Gwyneth Sprout, FNP   1 year ago [facility] [place], Wend[place], [place]   1 year ago Annual physical exam   [facility] [place] Post, [place]   1 year ago Colon cancer screening   [place], [place], [place]   3 years ago Acute bronchitis, unspecified organism   [place], [place], [place]      Future Appointments            In 3 months Nehemiah Massed Monia Sabal, MD [place]

## 2021-08-23 NOTE — Telephone Encounter (Signed)
Refusing rx due to dose inconsistent with current med list.  Requested Prescriptions  Pending Prescriptions Disp Refills   levothyroxine (SYNTHROID) 88 MCG tablet [Pharmacy Med Name: LEVOTHYROXINE 88 MCG TABLET] 30 tablet 0    Sig: TAKE 1 [place]     Endocrinology:  Hypothyroid Agents Failed - 08/22/2021 10:44 AM      Failed - TSH in normal range and within 360 days    TSH  Date Value Ref Range Status  06/16/2021 5.210 (H) 0.450 - 4.500 uIU/mL Final          Passed - Valid encounter within last 12 months    Recent Outpatient Visits           2 months ago Annual physical exam   [facility] Gwyneth Sprout, FNP   1 year ago [place], Wend[place] Bea[place], [place]   1 year ago Annual physical exam   [facility] Trin[place] Post, [place]   1 year ago Colon cancer screening   [place], [place], [place]   3 years ago Acute bronchitis, unspecified organism   [place], [place], [place]       Future Appointments             In 3 months Nehemiah Massed Monia Sabal, MD [place]

## 2021-09-17 ENCOUNTER — Other Ambulatory Visit: Payer: Self-pay | Admitting: Family Medicine

## 2021-09-17 DIAGNOSIS — E039 Hypothyroidism, unspecified: Secondary | ICD-10-CM

## 2021-09-19 NOTE — Telephone Encounter (Signed)
Requested Prescriptions  ?Pending Prescriptions Disp Refills  ?? levothyroxine (SYNTHROID) 100 MCG tablet [Pharmacy Med Name: LEVOTHYROXINE 100 MCG TABLET] 90 tablet 0  ?  Sig: TAKE 1 TABLET BY MOUTH EVERY DAY  ?  ? Endocrinology:  Hypothyroid Agents Failed - 09/17/2021  1:44 PM  ?  ?  Failed - TSH in normal range and within 360 days  ?  TSH  ?Date Value Ref Range Status  ?06/16/2021 5.210 (H) 0.450 - 4.500 uIU/mL Final  ?   ?  ?  Passed - Valid encounter within last 12 months  ?  Recent Outpatient Visits   ?      ? 3 months ago Annual physical exam  ? [facility] Tally Joe T, FNP  ? 1 year ago Bronchitis  ? [facility] [place], Wend[place], [place]  ? 1 year ago Annual physical exam  ? [facility] Carles Collet M, [place]  ? 2 years ago Colon cancer screening  ? [place], [place]  ? 3 years ago Acute bronchitis, unspecified organism  ? [place], [place]  ?  ?  ?Future Appointments   ?        ? In 3 months Ralene Bathe, MD [place]  ?  ? ?  ?  ?  ? ? ?

## 2021-10-10 ENCOUNTER — Encounter: Payer: Self-pay | Admitting: Family Medicine

## 2021-10-10 DIAGNOSIS — J309 Allergic rhinitis, unspecified: Secondary | ICD-10-CM

## 2021-10-18 ENCOUNTER — Encounter: Payer: Self-pay | Admitting: Dermatology

## 2021-11-29 IMAGING — MG DIGITAL SCREENING BILAT W/ TOMO W/ CAD
8 series · 8 of 24 positions shown · non-contrast
Comparison: Previous exam(s).

CLINICAL DATA: Screening.

EXAM:
DIGITAL SCREENING BILATERAL MAMMOGRAM WITH TOMO AND CAD

[R CC synth-2D]
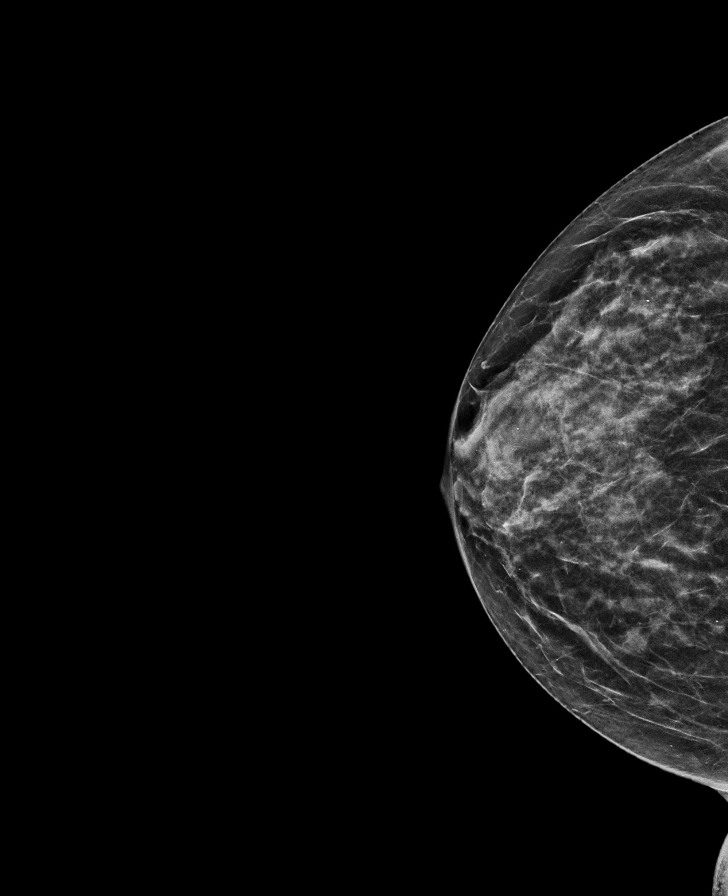

[L CC synth-2D]
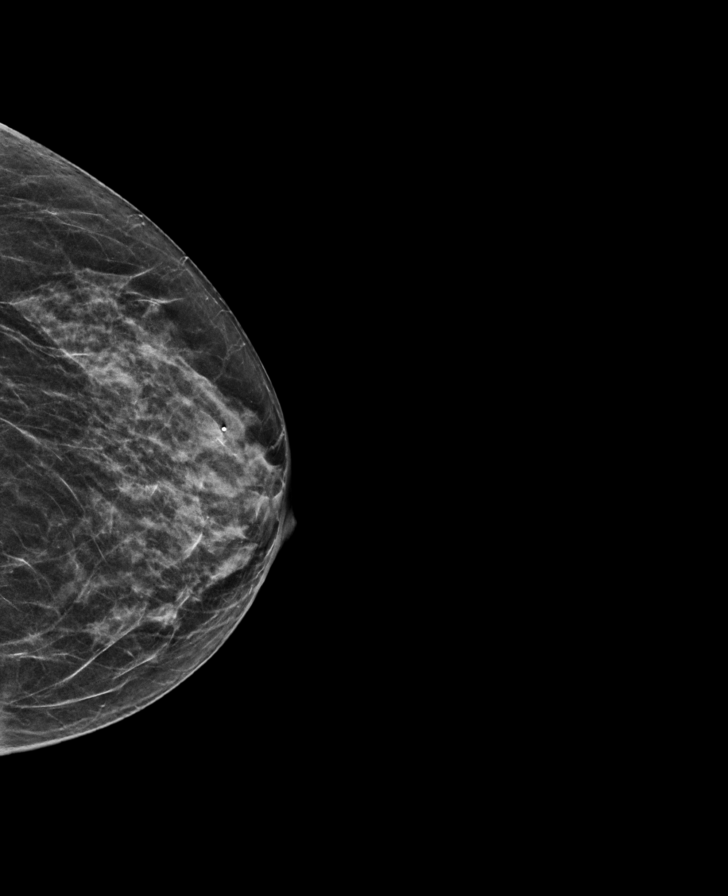

[R MLO synth-2D]
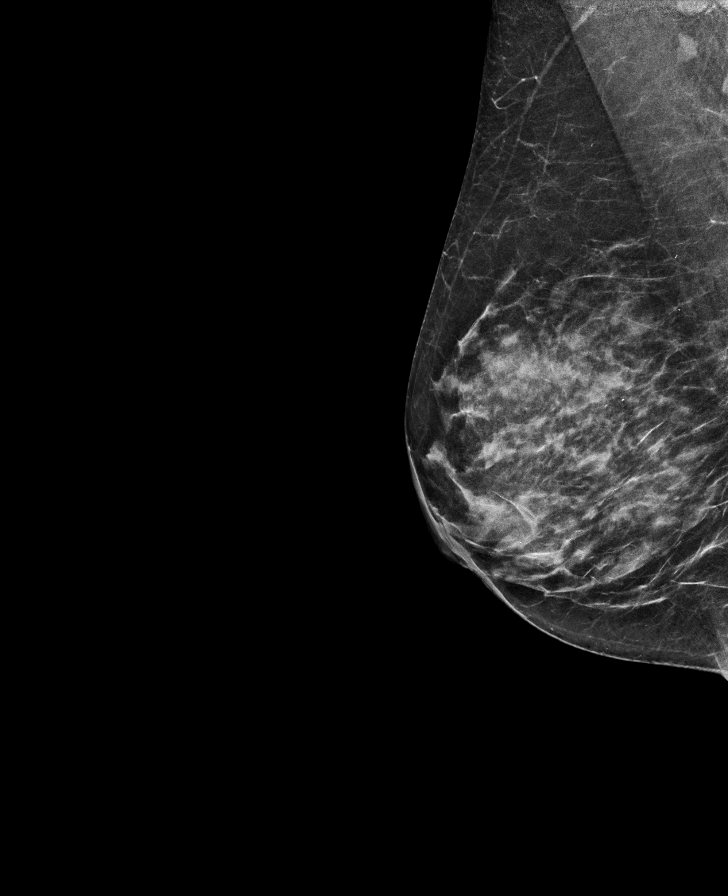

[L MLO synth-2D]
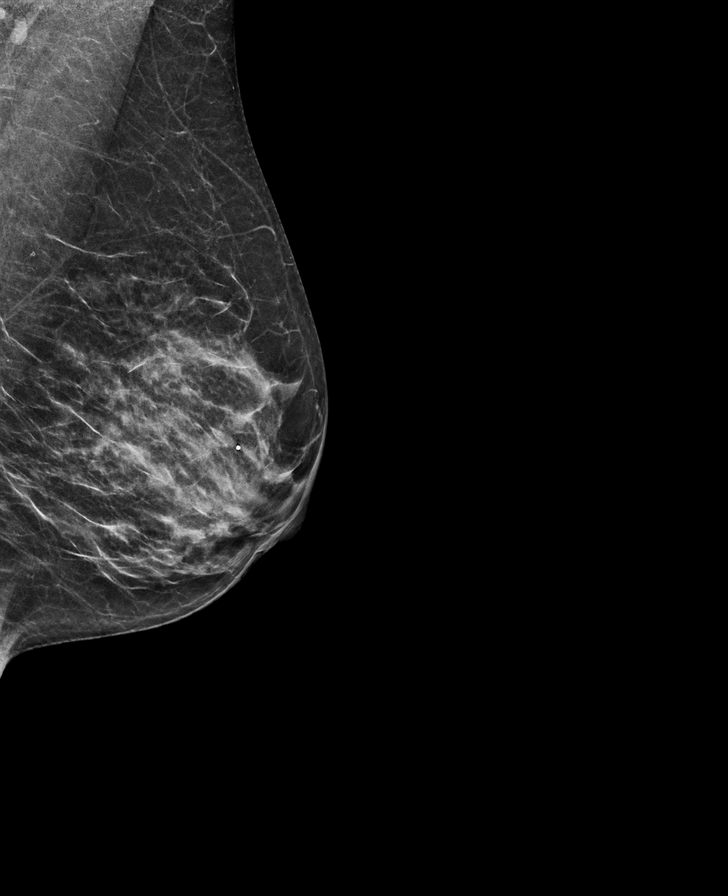

[L MLO tomo · tomo slice 27/52.0]
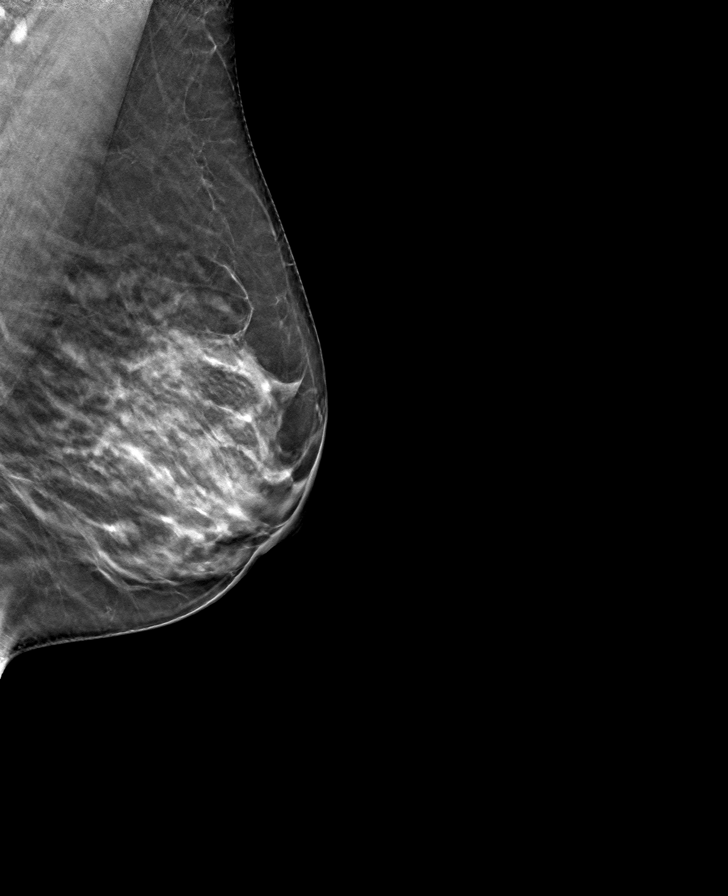

[R MLO tomo · tomo slice 28/55.0]
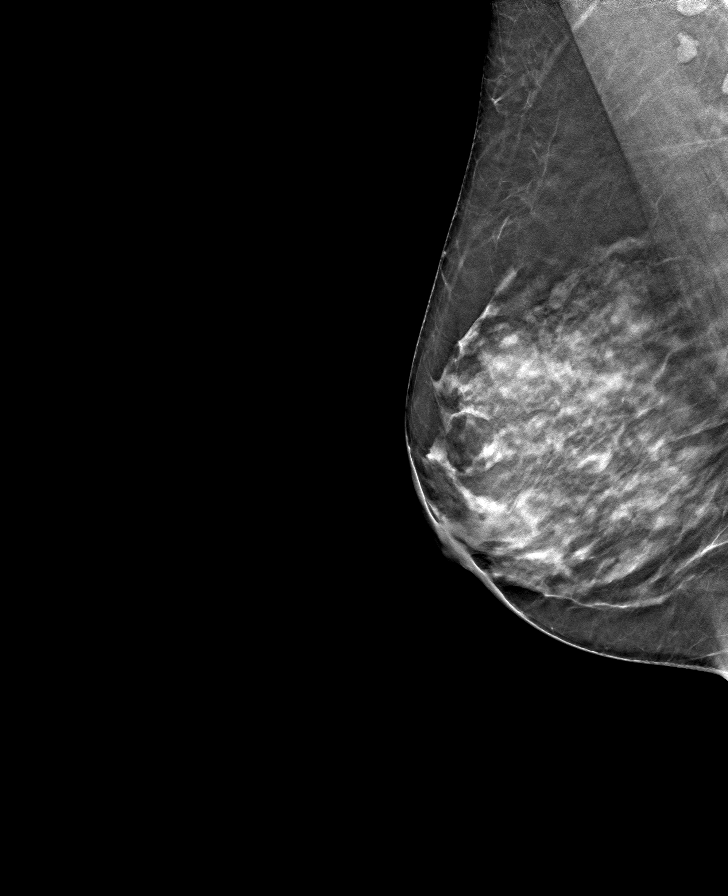

[L CC tomo · tomo slice 25/49.0]
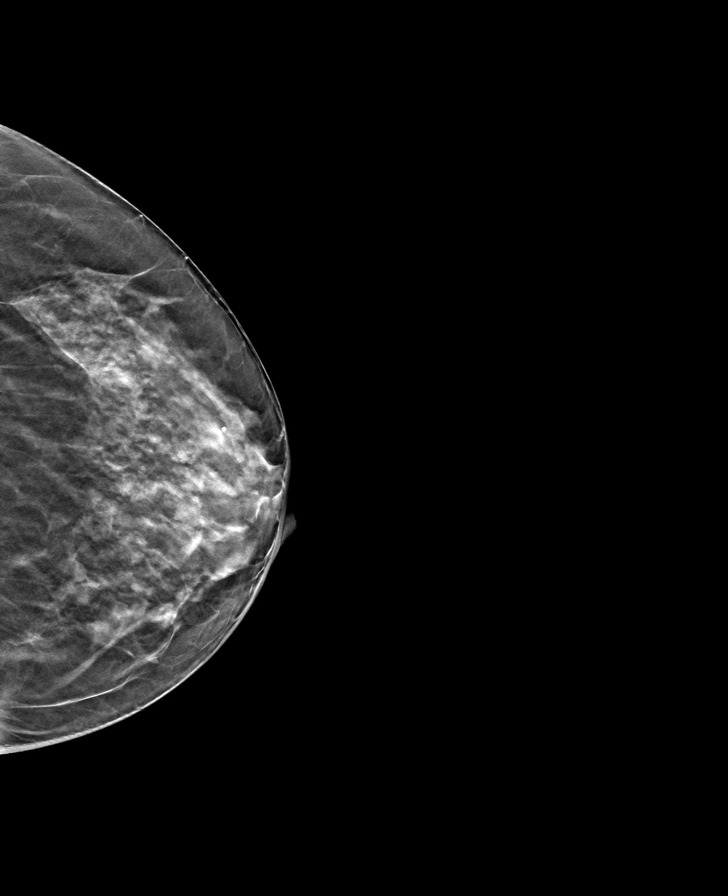

[R CC tomo · tomo slice 25/49.0]
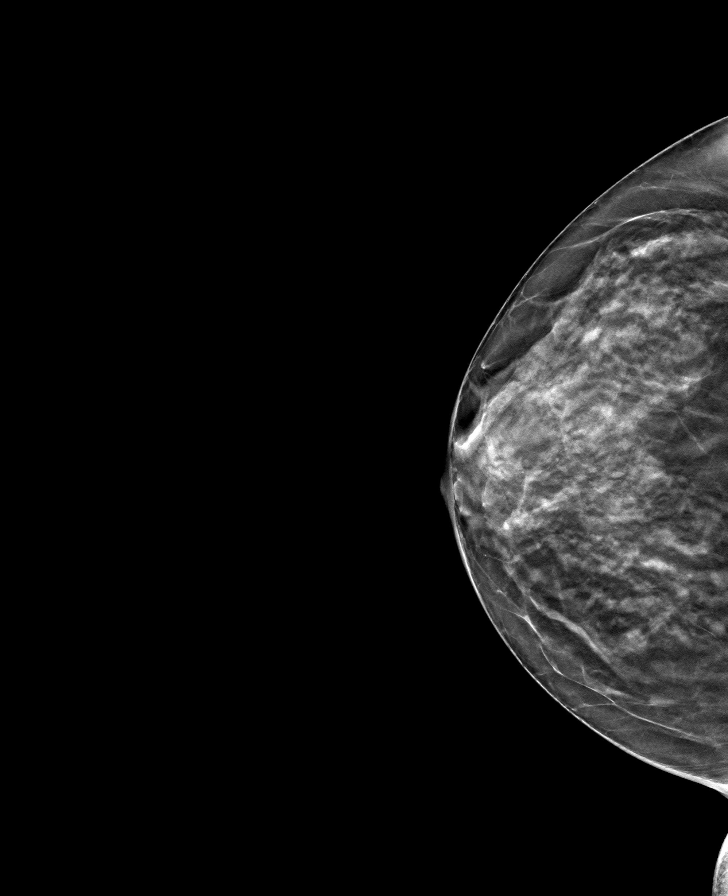

[8 of 24 positions shown; findings below may reference images not displayed]

ACR Breast Density Category c: The breast tissue is heterogeneously
dense, which may obscure small masses.
FINDINGS: There are no findings suspicious for malignancy. Images were
processed with CAD.
IMPRESSION: No mammographic evidence of malignancy. A result letter of this
screening mammogram will be mailed directly to the patient.

RECOMMENDATION:
Screening mammogram in one year. (Code:[id number])

BI-RADS CATEGORY  1: Negative.

## 2021-12-12 ENCOUNTER — Ambulatory Visit: Payer: BC Managed Care – PPO | Admitting: Dermatology

## 2021-12-20 ENCOUNTER — Encounter: Payer: Self-pay | Admitting: Dermatology

## 2021-12-20 ENCOUNTER — Ambulatory Visit: Payer: BC Managed Care – PPO | Admitting: Dermatology

## 2021-12-20 DIAGNOSIS — L814 Other melanin hyperpigmentation: Secondary | ICD-10-CM | POA: Diagnosis not present

## 2021-12-20 DIAGNOSIS — L409 Psoriasis, unspecified: Secondary | ICD-10-CM

## 2021-12-20 MED ORDER — MOMETASONE FUROATE 0.1 % EX CREA: TOPICAL_CREAM | CUTANEOUS | 2 refills | Status: DC

## 2021-12-20 NOTE — Patient Instructions (Signed)
Due to recent changes in healthcare laws, you may see results of your pathology and/or laboratory studies on MyChart before the doctors have had a chance to review them. We understand that in some cases there may be results that are confusing or concerning to you. Please understand that not all results are received at the same time and often the doctors may need to interpret multiple results in order to provide you with the best plan of care or course of treatment. Therefore, we ask that you please give us 2 business days to thoroughly review all your results before contacting the office for clarification. Should we see a critical lab result, you will be contacted sooner.   If You Need Anything After Your Visit  If you have any questions or concerns for your doctor, please call our main line at [phone] and press option 4 to reach your doctor's medical assistant. If no one answers, please leave a voicemail as directed and we will return your call as soon as possible. Messages left after 4 pm will be answered the following business day.   You may also send us a message via MyChart. We typically respond to MyChart messages within 1-2 business days.  For prescription refills, please ask your pharmacy to contact our office. Our fax number is [phone].  If you have an urgent issue when the clinic is closed that cannot wait until the next business day, you can page your doctor at the number below.    Please note that while we do our best to be available for urgent issues outside of office hours, we are not available 24/7.   If you have an urgent issue and are unable to reach us, you may choose to seek medical care at your doctor's office, retail clinic, urgent care center, or emergency room.  If you have a medical emergency, please immediately call 911 or go to the emergency department.  Pager Numbers  - Dr. Kowalski: [phone]  - Dr. Moye: [phone]  - Dr. Stewart:  [phone]  In the event of inclement weather, please call our main line at [phone] for an update on the status of any delays or closures.  Dermatology Medication Tips: Please keep the boxes that topical medications come in in order to help keep track of the instructions about where and how to use these. Pharmacies typically print the medication instructions only on the boxes and not directly on the medication tubes.   If your medication is too expensive, please contact our office at [phone] option 4 or send us a message through MyChart.   We are unable to tell what your co-pay for medications will be in advance as this is different depending on your insurance coverage. However, we may be able to find a substitute medication at lower cost or fill out paperwork to get insurance to cover a needed medication.   If a prior authorization is required to get your medication covered by your insurance company, please allow us 1-2 business days to complete this process.  Drug prices often vary depending on where the prescription is filled and some pharmacies may offer cheaper prices.  The website www.goodrx.com contains coupons for medications through different pharmacies. The prices here do not account for what the cost may be with help from insurance (it may be cheaper with your insurance), but the website can give you the price if you did not use any insurance.  - You can print the associated coupon and take it with   your prescription to the pharmacy.  - You may also stop by our office during regular business hours and pick up a GoodRx coupon card.  - If you need your prescription sent electronically to a different pharmacy, notify our office through [place] MyChart or by phone at [phone] option 4.     Si Usted Necesita Algo Despus de Su Visita  Tambin puede enviarnos un mensaje a travs de MyChart. Por lo general respondemos a los mensajes de MyChart en el transcurso de 1 a 2  das hbiles.  Para renovar recetas, por favor pida a su farmacia que se ponga en contacto con nuestra oficina. Nuestro nmero de fax es el [phone].  Si tiene un asunto urgente cuando la clnica est cerrada y que no puede esperar hasta el siguiente da hbil, puede llamar/localizar a su doctor(a) al nmero que aparece a continuacin.   Por favor, tenga en cuenta que aunque hacemos todo lo posible para estar disponibles para asuntos urgentes fuera del horario de oficina, no estamos disponibles las 24 horas del da, los 7 das de la semana.   Si tiene un problema urgente y no puede comunicarse con nosotros, puede optar por buscar atencin mdica  en el consultorio de su doctor(a), en una clnica privada, en un centro de atencin urgente o en una sala de emergencias.  Si tiene una emergencia mdica, por favor llame inmediatamente al 911 o vaya a la sala de emergencias.  Nmeros de bper  - Dr. Kowalski: [phone]  - Dra. Moye: [phone]  - Dra. Stewart: [phone]  En caso de inclemencias del tiempo, por favor llame a nuestra lnea principal al [phone] para una actualizacin sobre el estado de cualquier retraso o cierre.  Consejos para la medicacin en dermatologa: Por favor, guarde las cajas en las que vienen los medicamentos de uso tpico para ayudarle a seguir las instrucciones sobre dnde y cmo usarlos. Las farmacias generalmente imprimen las instrucciones del medicamento slo en las cajas y no directamente en los tubos del medicamento.   Si su medicamento es muy caro, por favor, pngase en contacto con nuestra oficina llamando al [phone] y presione la opcin 4 o envenos un mensaje a travs de MyChart.   No podemos decirle cul ser su copago por los medicamentos por adelantado ya que esto es diferente dependiendo de la cobertura de su seguro. Sin embargo, es posible que podamos encontrar un medicamento sustituto a menor costo o llenar un formulario para que el  seguro cubra el medicamento que se considera necesario.   Si se requiere una autorizacin previa para que su compaa de seguros cubra su medicamento, por favor permtanos de 1 a 2 das hbiles para completar este proceso.  Los precios de los medicamentos varan con frecuencia dependiendo del lugar de dnde se surte la receta y alguna farmacias pueden ofrecer precios ms baratos.  El sitio web www.goodrx.com tiene cupones para medicamentos de diferentes farmacias. Los precios aqu no tienen en cuenta lo que podra costar con la ayuda del seguro (puede ser ms barato con su seguro), pero el sitio web puede darle el precio si no utiliz ningn seguro.  - Puede imprimir el cupn correspondiente y llevarlo con su receta a la farmacia.  - Tambin puede pasar por nuestra oficina durante el horario de atencin regular y recoger una tarjeta de cupones de GoodRx.  - Si necesita que su receta se enve electrnicamente a una farmacia diferente, informe a nuestra oficina a travs de MyChart de [place]   o por telfono llamando al [phone] y presione la opcin 4.  

## 2021-12-20 NOTE — Progress Notes (Signed)
   New Patient Visit  Subjective  Cindy Rangel is a 61 y.o. female who presents for the following: Irregular lesions (On the B/L elbows for a few years - painful and itchy, patient is concerned and would like to discuss treatment options today). Pt has spot on nose to be checked.  The following portions of the chart were reviewed this encounter and updated as appropriate:   Tobacco  Allergies  Meds  Problems  Med Hx  Surg Hx  Fam Hx     Review of Systems:  No other skin or systemic complaints except as noted in HPI or Assessment and Plan.  Objective  Well appearing patient in no apparent distress; mood and affect are within normal limits.  A focused examination was performed including B/L arms. Relevant physical exam findings are noted in the Assessment and Plan.  B/L elbow Thickened scale on the elbows.     L nasal bridge 0.1 cm brown macule.    Assessment & Plan  Psoriasis B/L elbow  positive fhx of psoriasis in uncles -  Pt has No issues with joint aches or pains  Psoriasis is a chronic non-curable, but treatable genetic/hereditary disease that may have other systemic features affecting other organ systems such as joints (Psoriatic Arthritis). It is associated with an increased risk of inflammatory bowel disease, heart disease, non-alcoholic fatty liver disease, and depression.    Start Mometasone 0.1% cream QD 5d/wk. Topical steroids (such as triamcinolone, fluocinolone, fluocinonide, mometasone, clobetasol, halobetasol, betamethasone, hydrocortisone) can cause thinning and lightening of the skin if they are used for too long in the same area. Your physician has selected the right strength medicine for your problem and area affected on the body. Please use your medication only as directed by your physician to prevent side effects.   Consider switching to [facility] or Vtama at follow up to avoid long term topical steroids.  mometasone (ELOCON) 0.1 % cream - B/L  elbow Apply to aa's psoriasis QD up to 5d/wk.  Lentigo L nasal bridge Benign-appearing.  Observation.  Call clinic for new or changing lesions.  Recommend daily use of broad spectrum spf 30+ sunscreen to sun-exposed areas.   Return for psoriasis follow up in 3-4 mths.  Luther Redo, CMA, am acting as scribe for Sarina Ser, MD . Documentation: I have reviewed the above documentation for accuracy and completeness, and I agree with the above.  Sarina Ser, MD

## 2022-03-06 ENCOUNTER — Encounter: Payer: Self-pay | Admitting: Family Medicine

## 2022-03-06 DIAGNOSIS — M25561 Pain in right knee: Secondary | ICD-10-CM

## 2022-03-16 ENCOUNTER — Other Ambulatory Visit: Payer: Self-pay | Admitting: Family Medicine

## 2022-03-16 DIAGNOSIS — E039 Hypothyroidism, unspecified: Secondary | ICD-10-CM

## 2022-03-30 ENCOUNTER — Ambulatory Visit: Payer: BC Managed Care – PPO | Admitting: Dermatology

## 2022-03-30 DIAGNOSIS — Z79899 Other long term (current) drug therapy: Secondary | ICD-10-CM

## 2022-03-30 DIAGNOSIS — L409 Psoriasis, unspecified: Secondary | ICD-10-CM | POA: Diagnosis not present

## 2022-03-30 MED ORDER — VTAMA 1 % EX CREA: TOPICAL_CREAM | CUTANEOUS | 2 refills | Status: AC

## 2022-03-30 NOTE — Progress Notes (Signed)
   Follow-Up Visit   Subjective  Cindy Rangel is a 61 y.o. female who presents for the following: Follow-up (3 months f/u Psoriasis on the elbows, treating with Mometasone cream with a poor response).  The following portions of the chart were reviewed this encounter and updated as appropriate:   Tobacco  Allergies  Meds  Problems  Med Hx  Surg Hx  Fam Hx     Review of Systems:  No other skin or systemic complaints except as noted in HPI or Assessment and Plan.  Objective  Well appearing patient in no apparent distress; mood and affect are within normal limits.  A focused examination was performed including arms. Relevant physical exam findings are noted in the Assessment and Plan.  elbows Scale on the elbows   Assessment & Plan  Psoriasis elbows Chronic and persistent condition with duration or expected duration over one year. Condition is symptomatic / bothersome to patient. Not to goal.  Psoriasis is a chronic non-curable, but treatable genetic/hereditary disease that may have other systemic features affecting other organ systems such as joints (Psoriatic Arthritis). It is associated with an increased risk of inflammatory bowel disease, heart disease, non-alcoholic fatty liver disease, and depression.     D/c Mometasone cream  Start Vtama cream apply to affected skin once a day   May consider Zoryve in the future   Related Medications Tapinarof (VTAMA) 1 % CREA Apply to affected skin once a day   Return in about 6 months (around 09/28/2022) for Psoriasis .  IMarye Round, CMA, am acting as scribe for Sarina Ser, MD .  Documentation: I have reviewed the above documentation for accuracy and completeness, and I agree with the above.  Sarina Ser, MD

## 2022-03-30 NOTE — Patient Instructions (Signed)
Due to recent changes in healthcare laws, you may see results of your pathology and/or laboratory studies on MyChart before the doctors have had a chance to review them. We understand that in some cases there may be results that are confusing or concerning to you. Please understand that not all results are received at the same time and often the doctors may need to interpret multiple results in order to provide you with the best plan of care or course of treatment. Therefore, we ask that you please give us 2 business days to thoroughly review all your results before contacting the office for clarification. Should we see a critical lab result, you will be contacted sooner.   If You Need Anything After Your Visit  If you have any questions or concerns for your doctor, please call our main line at [phone] and press option 4 to reach your doctor's medical assistant. If no one answers, please leave a voicemail as directed and we will return your call as soon as possible. Messages left after 4 pm will be answered the following business day.   You may also send us a message via MyChart. We typically respond to MyChart messages within 1-2 business days.  For prescription refills, please ask your pharmacy to contact our office. Our fax number is [phone].  If you have an urgent issue when the clinic is closed that cannot wait until the next business day, you can page your doctor at the number below.    Please note that while we do our best to be available for urgent issues outside of office hours, we are not available 24/7.   If you have an urgent issue and are unable to reach us, you may choose to seek medical care at your doctor's office, retail clinic, urgent care center, or emergency room.  If you have a medical emergency, please immediately call 911 or go to the emergency department.  Pager Numbers  - Dr. Kowalski: [phone]  - Dr. Moye: [phone]  - Dr. Stewart:  [phone]  In the event of inclement weather, please call our main line at [phone] for an update on the status of any delays or closures.  Dermatology Medication Tips: Please keep the boxes that topical medications come in in order to help keep track of the instructions about where and how to use these. Pharmacies typically print the medication instructions only on the boxes and not directly on the medication tubes.   If your medication is too expensive, please contact our office at [phone] option 4 or send us a message through MyChart.   We are unable to tell what your co-pay for medications will be in advance as this is different depending on your insurance coverage. However, we may be able to find a substitute medication at lower cost or fill out paperwork to get insurance to cover a needed medication.   If a prior authorization is required to get your medication covered by your insurance company, please allow us 1-2 business days to complete this process.  Drug prices often vary depending on where the prescription is filled and some pharmacies may offer cheaper prices.  The website www.goodrx.com contains coupons for medications through different pharmacies. The prices here do not account for what the cost may be with help from insurance (it may be cheaper with your insurance), but the website can give you the price if you did not use any insurance.  - You can print the associated coupon and take it with   your prescription to the pharmacy.  - You may also stop by our office during regular business hours and pick up a GoodRx coupon card.  - If you need your prescription sent electronically to a different pharmacy, notify our office through [facility] MyChart or by phone at [phone] option 4.     Si Usted Necesita Algo Despus de Su Visita  Tambin puede enviarnos un mensaje a travs de MyChart. Por lo general respondemos a los mensajes de MyChart en el transcurso de 1 a 2  das hbiles.  Para renovar recetas, por favor pida a su farmacia que se ponga en contacto con nuestra oficina. Nuestro nmero de fax es el [phone].  Si tiene un asunto urgente cuando la clnica est cerrada y que no puede esperar hasta el siguiente da hbil, puede llamar/localizar a su doctor(a) al nmero que aparece a continuacin.   Por favor, tenga en cuenta que aunque hacemos todo lo posible para estar disponibles para asuntos urgentes fuera del horario de oficina, no estamos disponibles las 24 horas del da, los 7 das de la semana.   Si tiene un problema urgente y no puede comunicarse con nosotros, puede optar por buscar atencin mdica  en el consultorio de su doctor(a), en una clnica privada, en un centro de atencin urgente o en una sala de emergencias.  Si tiene una emergencia mdica, por favor llame inmediatamente al 911 o vaya a la sala de emergencias.  Nmeros de bper  - Dr. Kowalski: [phone]  - Dra. Moye: [phone]  - Dra. Stewart: [phone]  En caso de inclemencias del tiempo, por favor llame a nuestra lnea principal al [phone] para una actualizacin sobre el estado de cualquier retraso o cierre.  Consejos para la medicacin en dermatologa: Por favor, guarde las cajas en las que vienen los medicamentos de uso tpico para ayudarle a seguir las instrucciones sobre dnde y cmo usarlos. Las farmacias generalmente imprimen las instrucciones del medicamento slo en las cajas y no directamente en los tubos del medicamento.   Si su medicamento es muy caro, por favor, pngase en contacto con nuestra oficina llamando al [phone] y presione la opcin 4 o envenos un mensaje a travs de MyChart.   No podemos decirle cul ser su copago por los medicamentos por adelantado ya que esto es diferente dependiendo de la cobertura de su seguro. Sin embargo, es posible que podamos encontrar un medicamento sustituto a menor costo o llenar un formulario para que el  seguro cubra el medicamento que se considera necesario.   Si se requiere una autorizacin previa para que su compaa de seguros cubra su medicamento, por favor permtanos de 1 a 2 das hbiles para completar este proceso.  Los precios de los medicamentos varan con frecuencia dependiendo del lugar de dnde se surte la receta y alguna farmacias pueden ofrecer precios ms baratos.  El sitio web www.goodrx.com tiene cupones para medicamentos de diferentes farmacias. Los precios aqu no tienen en cuenta lo que podra costar con la ayuda del seguro (puede ser ms barato con su seguro), pero el sitio web puede darle el precio si no utiliz ningn seguro.  - Puede imprimir el cupn correspondiente y llevarlo con su receta a la farmacia.  - Tambin puede pasar por nuestra oficina durante el horario de atencin regular y recoger una tarjeta de cupones de GoodRx.  - Si necesita que su receta se enve electrnicamente a una farmacia diferente, informe a nuestra oficina a travs de MyChart de [place]   o por telfono llamando al [phone] y presione la opcin 4.  

## 2022-04-04 ENCOUNTER — Encounter: Payer: Self-pay | Admitting: Dermatology

## 2022-06-14 ENCOUNTER — Other Ambulatory Visit: Payer: Self-pay | Admitting: Family Medicine

## 2022-06-14 DIAGNOSIS — E039 Hypothyroidism, unspecified: Secondary | ICD-10-CM

## 2022-06-14 NOTE — Telephone Encounter (Signed)
Requested medication (s) are due for refill today: yes  Requested medication (s) are on the active medication list: yes  Last refill:  03/16/22  Future visit scheduled:no  Notes to clinic:  Unable to refill per protocol, courtesy refill already given, routing for provider approval.      Requested Prescriptions  Pending Prescriptions Disp Refills   levothyroxine (SYNTHROID) 100 MCG tablet [Pharmacy Med Name: LEVOTHYROXINE 100 MCG TABLET] 90 tablet 0    Sig: Take 1 tablet (100 mcg total) by mouth daily before breakfast. Please schedule office visit before any future refill.     Endocrinology:  Hypothyroid Agents Failed - 06/14/2022  1:38 AM      Failed - TSH in normal range and within 360 days    TSH  Date Value Ref Range Status  06/16/2021 5.210 (H) 0.450 - 4.500 uIU/mL Final         Failed - Valid encounter within last 12 months    Recent Outpatient Visits           12 months ago Annual physical exam   [facility] Gwyneth Sprout, FNP   2 years ago [place], Wendee Beavers, [place]   2 years ago Annual physical exam   [place], [place]   2 years ago Colon cancer screening   [place], [place], [place]   3 years ago Acute bronchitis, unspecified organism   [place], [place], [place]       Future Appointments             In 3 months Nehemiah Massed Monia Sabal, MD [place]

## 2022-09-27 ENCOUNTER — Ambulatory Visit: Payer: BC Managed Care – PPO | Admitting: Dermatology

## 2022-09-28 ENCOUNTER — Ambulatory Visit: Payer: BC Managed Care – PPO | Admitting: Dermatology

## 2022-10-07 ENCOUNTER — Encounter: Payer: Self-pay | Admitting: Family Medicine

## 2023-01-08 ENCOUNTER — Other Ambulatory Visit: Payer: Self-pay | Admitting: Family Medicine

## 2023-01-08 DIAGNOSIS — E039 Hypothyroidism, unspecified: Secondary | ICD-10-CM

## 2023-01-08 MED ORDER — LEVOTHYROXINE SODIUM 100 MCG PO TABS
100.0000 ug | ORAL_TABLET | Freq: Every day | ORAL | 0 refills | Status: DC
Start: 2023-01-08 — End: 2023-01-26

## 2023-01-24 ENCOUNTER — Other Ambulatory Visit: Payer: Self-pay | Admitting: Family Medicine

## 2023-01-24 ENCOUNTER — Ambulatory Visit: Payer: BC Managed Care – PPO | Admitting: Family Medicine

## 2023-01-24 VITALS — BP 125/78 | HR 81 | Ht 63.0 in | Wt 165.5 lb

## 2023-01-24 DIAGNOSIS — E039 Hypothyroidism, unspecified: Secondary | ICD-10-CM | POA: Diagnosis not present

## 2023-01-24 DIAGNOSIS — Z6829 Body mass index (BMI) 29.0-29.9, adult: Secondary | ICD-10-CM | POA: Insufficient documentation

## 2023-01-24 DIAGNOSIS — E7800 Pure hypercholesterolemia, unspecified: Secondary | ICD-10-CM | POA: Diagnosis not present

## 2023-01-24 DIAGNOSIS — E559 Vitamin D deficiency, unspecified: Secondary | ICD-10-CM

## 2023-01-24 DIAGNOSIS — Z1231 Encounter for screening mammogram for malignant neoplasm of breast: Secondary | ICD-10-CM | POA: Insufficient documentation

## 2023-01-24 DIAGNOSIS — J410 Simple chronic bronchitis: Secondary | ICD-10-CM | POA: Diagnosis not present

## 2023-01-24 MED ORDER — MONTELUKAST SODIUM 10 MG PO TABS
10.0000 mg | ORAL_TABLET | Freq: Every day | ORAL | 3 refills | Status: AC
Start: 2023-01-24 — End: ?

## 2023-01-24 NOTE — Assessment & Plan Note (Signed)
Chronic; stable Defer low dose CT at this time given age Continue to monitor rhinitis and asthma symptoms Discussed assistive tricks and home changes to assist Start singulair nightly to assist

## 2023-01-24 NOTE — Assessment & Plan Note (Signed)
The 10-year ASCVD risk score (Arnett DK, et al., 2019) is: 3.6% Repeat LP BP remains stable recommend diet low in saturated fat and regular exercise - 30 min at least 5 times per week

## 2023-01-24 NOTE — Assessment & Plan Note (Signed)
Slight weight gain Repeat CBC, CMP, LP Body mass index is 29.32 kg/m. Discussed importance of healthy weight management Discussed diet and exercise

## 2023-01-24 NOTE — Progress Notes (Signed)
Established patient visit   Patient: Cindy Rangel   DOB: Jan 12, 1961   62 y.o. Female  MRN: [id number] Visit Date: 01/24/2023  Today's healthcare provider: Jacky Kindle, FNP   Chief Complaint  Patient presents with   Medication Refill    Patient is being seen in regards to medication refills. She would like to receive a refill on her levothyroxine and Vitamin D capsules. Patient reports she is taking medications as prescribed. Patient reports no symptoms.   Subjective    Medication Refill   HPI     Medication Refill    Additional comments: Patient is being seen in regards to medication refills. She would like to receive a refill on her levothyroxine and Vitamin D capsules. Patient reports she is taking medications as prescribed. Patient reports no symptoms.      Last edited by Acey Lav, CMA on 01/24/2023  1:24 PM.      Medications: Outpatient Medications Prior to Visit  Medication Sig   albuterol (VENTOLIN HFA) 108 (90 Base) MCG/ACT inhaler Inhale 2 puffs into the lungs every 6 (six) hours as needed for wheezing or shortness of breath.   Cholecalciferol (VITAMIN D) 2000 units CAPS Take 1 capsule by mouth daily.   fexofenadine (ALLEGRA) 180 MG tablet Take 180 mg by mouth daily.   folic acid (FOLVITE) 1 MG tablet Take 1 mg by mouth daily.   levothyroxine (SYNTHROID) 100 MCG tablet Take 1 tablet (100 mcg total) by mouth daily before breakfast. Please schedule office visit before any future refill.   Multiple Vitamin (MULTIVITAMIN) capsule Take by mouth.   Tapinarof (VTAMA) 1 % CREA Apply to affected skin once a day   Vitamin D, Ergocalciferol, (DRISDOL) 1.25 MG (50000 UNIT) CAPS capsule Take 1 capsule (50,000 Units total) by mouth every 7 (seven) days.   No facility-administered medications prior to visit.    Review of Systems    Objective    BP 125/78 (BP Location: Left Arm, Patient Position: Sitting, Cuff Size: Normal)   Pulse 81   Ht 5\' 3"  (1.6 m)   Wt  165 lb 8 oz (75.1 kg)   SpO2 98%   BMI 29.32 kg/m   Physical Exam Vitals and nursing note reviewed.  Constitutional:      General: She is not in acute distress.    Appearance: Normal appearance. She is overweight. She is not ill-appearing, toxic-appearing or diaphoretic.  HENT:     Head: Normocephalic and atraumatic.  Cardiovascular:     Rate and Rhythm: Normal rate and regular rhythm.     Pulses: Normal pulses.     Heart sounds: Normal heart sounds. No murmur heard.    No friction rub. No gallop.  Pulmonary:     Effort: Pulmonary effort is normal. No respiratory distress.     Breath sounds: Normal breath sounds. No stridor. No wheezing, rhonchi or rales.  Chest:     Chest wall: No tenderness.  Musculoskeletal:        General: No swelling, tenderness, deformity or signs of injury. Normal range of motion.     Right lower leg: No edema.     Left lower leg: No edema.  Skin:    General: Skin is warm and dry.     Capillary Refill: Capillary refill takes less than 2 seconds.     Coloration: Skin is not jaundiced or pale.     Findings: No bruising, erythema, lesion or rash.  Neurological:     General:  No focal deficit present.     Mental Status: She is alert and oriented to person, place, and time. Mental status is at baseline.     Cranial Nerves: No cranial nerve deficit.     Sensory: No sensory deficit.     Motor: No weakness.     Coordination: Coordination normal.  Psychiatric:        Mood and Affect: Mood normal.        Behavior: Behavior normal.        Thought Content: Thought content normal.        Judgment: Judgment normal.      No results found for any visits on 01/24/23.  Assessment & Plan     Problem List Items Addressed This Visit       Respiratory   Chronic bronchitis, simple (HCC)    Chronic; stable Defer low dose CT at this time given age Continue to monitor rhinitis and asthma symptoms Discussed assistive tricks and home changes to assist Start  singulair nightly to assist       Relevant Medications   montelukast (SINGULAIR) 10 MG tablet     Endocrine   Adult hypothyroidism - Primary    Chronic; previously slightly subclinical Repeat labs Previously on 100 mcg       Relevant Orders   TSH + free T4     Other   Avitaminosis D    Chronic; previously low on Rx dose supplements weekly Repeat labs Mom with hx of OP      Relevant Orders   Vitamin D (25 hydroxy)   BMI 29.0-29.9,adult    Slight weight gain Repeat CBC, CMP, LP Body mass index is 29.32 kg/m. Discussed importance of healthy weight management Discussed diet and exercise       Relevant Orders   Comprehensive Metabolic Panel (CMET)   CBC with Differential/Platelet   Lipid panel   Elevated LDL cholesterol level    The 10-year ASCVD risk score (Arnett DK, et al., 2019) is: 3.6% Repeat LP BP remains stable recommend diet low in saturated fat and regular exercise - 30 min at least 5 times per week       Relevant Orders   Lipid panel   Hypercholesteremia    The 10-year ASCVD risk score (Arnett DK, et al., 2019) is: 3.6% Repeat LP BP remains stable recommend diet low in saturated fat and regular exercise - 30 min at least 5 times per week       Relevant Orders   Lipid panel   Screening mammogram for breast cancer    Due for screening for mammogram, denies breast concerns, provided with phone number to call and schedule appointment for mammogram. Encouraged to repeat breast cancer screening every 1-2 years.       Relevant Orders   MM 3D SCREENING MAMMOGRAM BILATERAL BREAST   Return in about 3 months (around 04/26/2023) for annual examination.     Leilani Merl, FNP, have reviewed all documentation for this visit. The documentation on 01/24/23 for the exam, diagnosis, procedures, and orders are all accurate and complete.  Jacky Kindle, FNP  [facility] [phone] (phone) [phone] (fax)  [facility]

## 2023-01-24 NOTE — Assessment & Plan Note (Signed)
Due for screening for mammogram, denies breast concerns, provided with phone number to call and schedule appointment for mammogram. Encouraged to repeat breast cancer screening every 1-2 years.  

## 2023-01-24 NOTE — Assessment & Plan Note (Signed)
Chronic; previously slightly subclinical Repeat labs Previously on 100 mcg

## 2023-01-24 NOTE — Assessment & Plan Note (Signed)
Chronic; previously low on Rx dose supplements weekly Repeat labs Mom with hx of OP

## 2023-01-25 ENCOUNTER — Ambulatory Visit
Admission: RE | Admit: 2023-01-25 | Discharge: 2023-01-25 | Disposition: A | Discharge status: Home / Self Care | Payer: BC Managed Care – PPO | Source: Ambulatory Visit | Attending: Family Medicine | Admitting: Family Medicine

## 2023-01-25 DIAGNOSIS — Z1231 Encounter for screening mammogram for malignant neoplasm of breast: Secondary | ICD-10-CM | POA: Diagnosis present

## 2023-01-25 LAB — LIPID PANEL
Chol/HDL Ratio: 4.6 ratio — ABNORMAL HIGH (ref 0.0–4.4)
Cholesterol, Total: 260 mg/dL — ABNORMAL HIGH (ref 100–199)
HDL: 56 mg/dL (ref >39)
LDL Chol Calc (NIH): 146 mg/dL — ABNORMAL HIGH (ref 0–99)
Triglycerides: 316 mg/dL — ABNORMAL HIGH (ref 0–149)
VLDL Cholesterol Cal: 58 mg/dL — ABNORMAL HIGH (ref 5–40)

## 2023-01-25 LAB — COMPREHENSIVE METABOLIC PANEL
ALT: 25 IU/L (ref 0–32)
AST: 29 IU/L (ref 0–40)
Albumin: 4.1 g/dL (ref 3.9–4.9)
Alkaline Phosphatase: 133 IU/L — ABNORMAL HIGH (ref 44–121)
BUN/Creatinine Ratio: 25 (ref 12–28)
BUN: 20 mg/dL (ref 8–27)
Bilirubin Total: 0.3 mg/dL (ref 0.0–1.2)
CO2: 24 mmol/L (ref 20–29)
Calcium: 9.7 mg/dL (ref 8.7–10.3)
Chloride: 105 mmol/L (ref 96–106)
Creatinine, Ser: 0.79 mg/dL (ref 0.57–1.00)
Globulin, Total: 2.5 g/dL (ref 1.5–4.5)
Glucose: 81 mg/dL (ref 70–99)
Potassium: 4.6 mmol/L (ref 3.5–5.2)
Sodium: 141 mmol/L (ref 134–144)
Total Protein: 6.6 g/dL (ref 6.0–8.5)
eGFR: 85 mL/min/{1.73_m2} (ref >59)

## 2023-01-25 LAB — CBC WITH DIFFERENTIAL/PLATELET
Basophils Absolute: 0.1 10*3/uL (ref 0.0–0.2)
Basos: 1 %
EOS (ABSOLUTE): 0.2 10*3/uL (ref 0.0–0.4)
Eos: 3 %
Hematocrit: 39.9 % (ref 34.0–46.6)
Hemoglobin: 12.8 g/dL (ref 11.1–15.9)
Immature Grans (Abs): 0 10*3/uL (ref 0.0–0.1)
Immature Granulocytes: 0 %
Lymphocytes Absolute: 2.7 10*3/uL (ref 0.7–3.1)
Lymphs: 39 %
MCH: 29.3 pg (ref 26.6–33.0)
MCHC: 32.1 g/dL (ref 31.5–35.7)
MCV: 91 fL (ref 79–97)
Monocytes Absolute: 0.7 10*3/uL (ref 0.1–0.9)
Monocytes: 10 %
Neutrophils Absolute: 3.3 10*3/uL (ref 1.4–7.0)
Neutrophils: 47 %
Platelets: 314 10*3/uL (ref 150–450)
RBC: 4.37 x10E6/uL (ref 3.77–5.28)
RDW: 13.3 % (ref 11.7–15.4)
WBC: 7 10*3/uL (ref 3.4–10.8)

## 2023-01-25 LAB — TSH+FREE T4
Free T4: 0.96 ng/dL (ref 0.82–1.77)
TSH: 8.97 u[IU]/mL — ABNORMAL HIGH (ref 0.450–4.500)

## 2023-01-25 LAB — VITAMIN D 25 HYDROXY (VIT D DEFICIENCY, FRACTURES): Vit D, 25-Hydroxy: 33.5 ng/mL (ref 30.0–100.0)

## 2023-01-26 ENCOUNTER — Other Ambulatory Visit: Payer: Self-pay | Admitting: Family Medicine

## 2023-01-26 DIAGNOSIS — E039 Hypothyroidism, unspecified: Secondary | ICD-10-CM

## 2023-01-26 DIAGNOSIS — E559 Vitamin D deficiency, unspecified: Secondary | ICD-10-CM

## 2023-01-26 MED ORDER — LEVOTHYROXINE SODIUM 125 MCG PO TABS: 125.0000 ug | ORAL_TABLET | Freq: Every day | ORAL | 0 refills | Status: DC

## 2023-01-26 MED ORDER — VITAMIN D (ERGOCALCIFEROL) 1.25 MG (50000 UNIT) PO CAPS
50000.0000 [IU] | ORAL_CAPSULE | ORAL | 0 refills | Status: AC
Start: 2023-01-26 — End: ?

## 2023-01-26 NOTE — Progress Notes (Signed)
Recommend dose increase in synthroid given elevated TSH; Cholesterol remains elevated- total, fats, bad/LDL cholesterol remain elevated. Recommendation is for statin to assist. Continue to work on healthy diet and exercise; recommend diet low in saturated fat and regular exercise - 30 min at least 5 times per week  Vit D borderline; recommend continued use of supplementation.

## 2023-01-29 ENCOUNTER — Encounter: Payer: Self-pay | Admitting: Family Medicine

## 2023-02-06 ENCOUNTER — Other Ambulatory Visit: Payer: Self-pay | Admitting: Family Medicine

## 2023-02-06 MED ORDER — ROSUVASTATIN CALCIUM 10 MG PO TABS: 10.0000 mg | ORAL_TABLET | Freq: Every day | ORAL | 3 refills | Status: AC

## 2023-05-05 ENCOUNTER — Other Ambulatory Visit: Payer: Self-pay | Admitting: Family Medicine

## 2023-05-07 NOTE — Telephone Encounter (Signed)
Requested medications are due for refill today.  yes  Requested medications are on the active medications list.  yes  Last refill. 01/26/2023 #90 0 rf  Future visit scheduled.   no  Notes to clinic.  Abnormal labs.    Requested Prescriptions  Pending Prescriptions Disp Refills   levothyroxine (SYNTHROID) 125 MCG tablet [Pharmacy Med Name: LEVOTHYROXINE 125 MCG TABLET] 90 tablet 0    Sig: TAKE 1 TABLET BY MOUTH EVERY DAY     Endocrinology:  Hypothyroid Agents Failed - 05/05/2023  5:19 PM      Failed - TSH in normal range and within 360 days    TSH  Date Value Ref Range Status  01/24/2023 8.970 (H) 0.450 - 4.500 uIU/mL Final         Passed - Valid encounter within last 12 months    Recent Outpatient Visits           3 months ago Adult hypothyroidism   [facility] Jacky Kindle, FNP   1 year ago Annual physical exam   [facility] Jacky Kindle, FNP   3 years ago Bronchitis   [facility] [place], Lavella [place]ock, [place]   3 years ago Annual physical exam   [facility] [address], Ricki Rodriguez [place], [place]   3 years ago Colon cancer screening   [facility] [place], Ricki Rodriguez [place], [place]

## 2023-05-14 ENCOUNTER — Encounter: Payer: Self-pay | Admitting: Family Medicine

## 2023-05-16 ENCOUNTER — Other Ambulatory Visit: Payer: Self-pay | Admitting: Family Medicine

## 2023-05-16 DIAGNOSIS — R194 Change in bowel habit: Secondary | ICD-10-CM

## 2023-06-12 ENCOUNTER — Other Ambulatory Visit: Payer: Self-pay | Admitting: Family Medicine

## 2023-06-12 ENCOUNTER — Ambulatory Visit: Payer: BC Managed Care – PPO | Admitting: Family Medicine

## 2023-07-24 ENCOUNTER — Ambulatory Visit: Payer: 59 | Admitting: Gastroenterology

## 2023-07-24 VITALS — BP 131/85 | HR 73 | Temp 97.9°F | Ht 63.0 in | Wt 175.6 lb

## 2023-07-24 DIAGNOSIS — R194 Change in bowel habit: Secondary | ICD-10-CM

## 2023-07-24 DIAGNOSIS — R748 Abnormal levels of other serum enzymes: Secondary | ICD-10-CM | POA: Diagnosis not present

## 2023-07-24 DIAGNOSIS — K588 Other irritable bowel syndrome: Secondary | ICD-10-CM

## 2023-07-24 NOTE — Addendum Note (Signed)
 Addended by: Adela Ports on: 07/24/2023 01:53 PM   Modules accepted: Orders

## 2023-07-24 NOTE — Progress Notes (Signed)
 Ruel Kung MD, MRCP(U.K) [address]  [address]  [place], [place] [address]  Main: [phone]  Fax: [phone]   Gastroenterology Consultation  Referring Provider:     Emilio Kelly DASEN, FNP Primary Care Physician:  Emilio Kelly DASEN, FNP (Inactive) Primary Gastroenterologist:  Dr. Ruel Kung  Reason for Consultation:    Change in bowel habits        HPI:   Cindy Rangel is a 63 y.o. y/o female referred for consultation & management  by  Emilio Kelly DASEN, FNP (Inactive).     01/24/2023: Hemoglobin 12.8 g, CMP alkaline phosphatase 133  10/31/2019 colonoscopy: 3 mm polyp in the ascending colon resected otherwise colon appeared normal.  Polyp was a tubular adenoma.   She says that for the past 2 to 3 months after she had a viral illness she has noticed that she has urgency after she has a meal.  Has watery stools with preceding cramping.  No blood.  No weight loss.  Does consume soda every day.  No other artificial sugars weakness.  Has a lot of gas and bloating.  No other complaints.  Past Medical History:  Diagnosis Date   Allergic rhinitis    Dysplastic nevus 06/24/2008   Right upper arm. Slight atypia, edges free.   Hypercholesterolemia    Thyroid disease    hypothyroidism    Vitamin D  deficiency     Past Surgical History:  Procedure Laterality Date   APPENDECTOMY     CESAREAN SECTION     COLONOSCOPY WITH PROPOFOL  N/A 10/31/2019   Procedure: COLONOSCOPY WITH PROPOFOL ;  Surgeon: Kung Ruel, MD;  Location: [facility] ENDOSCOPY;  Service: Gastroenterology;  Laterality: N/A;   FASCIOTOMY  1983   history of due to compartment syndrome    Prior to Admission medications   Medication Sig Start Date End Date Taking? Authorizing Provider  rosuvastatin  (CRESTOR ) 10 MG tablet Take 1 tablet (10 mg total) by mouth daily. 02/06/23   Emilio Kelly DASEN, FNP  albuterol  (VENTOLIN  HFA) 108 (90 Base) MCG/ACT inhaler Inhale 2 puffs into the lungs every 6 (six) hours as needed for wheezing or  shortness of breath. 04/12/20   Ashok Kathrine HERO, PA-C  Cholecalciferol (VITAMIN D ) 2000 units CAPS Take 1 capsule by mouth daily.    [provider]  fexofenadine (ALLEGRA) 180 MG tablet Take 180 mg by mouth daily.    [provider]  folic acid (FOLVITE) 1 MG tablet Take 1 mg by mouth daily.    [provider]  levothyroxine  (SYNTHROID ) 125 MCG tablet TAKE 1 TABLET BY MOUTH EVERY DAY 06/12/23   Emilio Kelly T, FNP  montelukast  (SINGULAIR ) 10 MG tablet Take 1 tablet (10 mg total) by mouth at bedtime. 01/24/23   Emilio Kelly DASEN, FNP  Multiple Vitamin (MULTIVITAMIN) capsule Take by mouth.    [provider]  Tapinarof  (VTAMA ) 1 % CREA Apply to affected skin once a day 03/30/22   Hester Alm BROCKS, MD  Vitamin D , Ergocalciferol , (DRISDOL ) 1.25 MG (50000 UNIT) CAPS capsule Take 1 capsule (50,000 Units total) by mouth every 7 (seven) days. 01/26/23   Emilio Kelly DASEN, FNP    Family History  Problem Relation Age of Onset   Hypertension Mother    Cancer Mother 25       breast   Hypothyroidism Mother    Breast cancer Mother 93   Cancer Father        skin   Hypertension Father    Diabetes  Father        type 2   Kidney disease Father    Congestive Heart Failure Father    Colon polyps Father    Lupus Sister    Diabetes Brother    Healthy Sister    Healthy Sister    Epilepsy Brother    Celiac disease Brother    Cerebral palsy Brother    Healthy Grandchild      Social History   Tobacco Use   Smoking status: Former    Types: Cigarettes   Smokeless tobacco: Never  Vaping Use   Vaping status: Never Used  Substance Use Topics   Alcohol use: Yes    Alcohol/week: 0.0 standard drinks of alcohol    Comment: rare   Drug use: No    Allergies as of 07/24/2023   (No Known Allergies)    Review of Systems:    All systems reviewed and negative except where noted in HPI.   Physical Exam:  BP 131/85   Pulse 73   Temp 97.9 F (36.6 C) (Oral)   Ht 5' 3  (1.6 m)   Wt 175 lb 9.6 oz (79.7 kg)   BMI 31.11 kg/m  No LMP recorded. Patient is postmenopausal. Psych:  Alert and cooperative. Normal mood and affect. General:   Alert,  Well-developed, well-nourished, pleasant and cooperative in NAD Head:  Normocephalic and atraumatic. Eyes:  Sclera clear, no icterus.   Conjunctiva pink. Ears:  Normal auditory acuity. Heart:  Regular rate and rhythm; no murmurs, clicks, rubs, or gallops. Abdomen:  Normal bowel sounds.  No bruits.  Soft, non-tender and non-distended without masses, hepatosplenomegaly or hernias noted.  No guarding or rebound tenderness.    Neurologic:  Alert and oriented x3;  grossly normal neurologically. Psych:  Alert and cooperative. Normal mood and affect.  Imaging Studies: No results found.  Assessment and Plan:   Cindy Rangel is a 63 y.o. y/o female has been referred for habits.  Noted to have elevated alkaline phosphatase.  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  Advised her to stop all artificial sugars and sweeteners dairy and he should hopefully resolve over the next 3 to 4 weeks  Plan 1.  Colonoscopy if symptoms do not resolve in 3 to 4 weeks despite conservative management 2.  Recheck LFTs and GGT if GGT is elevated related to the liver then would require full autoimmune and viral hepatitis workup with imaging if GGT is normal then the elevated alkaline phosphatase is not related to the liver further workup would need to be pursued by primary care provider  Follow up in 6 to 8 weeks  Dr Ruel Kung MD,MRCP(U.K)

## 2023-07-27 LAB — HEPATIC FUNCTION PANEL
ALT: 21 [IU]/L (ref 0–32)
AST: 25 [IU]/L (ref 0–40)
Albumin: 4.6 g/dL (ref 3.9–4.9)
Alkaline Phosphatase: 131 [IU]/L — ABNORMAL HIGH (ref 44–121)
Bilirubin Total: 0.3 mg/dL (ref 0.0–1.2)
Bilirubin, Direct: 0.09 mg/dL (ref 0.00–0.40)
Total Protein: 7.3 g/dL (ref 6.0–8.5)

## 2023-07-27 LAB — GAMMA GT: GGT: 32 [IU]/L (ref 0–60)

## 2023-07-31 ENCOUNTER — Telehealth: Payer: Self-pay

## 2023-07-31 NOTE — Telephone Encounter (Signed)
-----   Message from Wyline Mood sent at 07/27/2023  7:51 AM EST ----- Kandis Cocking inform alk phos elevation is not related to liver since GGT is normal. Can be followed up with Jacky Kindle, FNP for any other causes if indicated

## 2023-07-31 NOTE — Telephone Encounter (Signed)
Called patient to let her know of her alkaline phosphatase lab results being elevated and that it's not due to her liver. I also let her know that I had reached out to her PCP-Mrs. Merita Norton, FNP to inform her about her lab levels and that it's not due to her liver. So I let the patient know that she should receive a call from Mrs. Payne's office to schedule an appointment. Patient understood and had no further questions.

## 2023-09-04 ENCOUNTER — Ambulatory Visit: Payer: 59 | Admitting: Gastroenterology

## 2023-10-17 ENCOUNTER — Telehealth: Payer: Self-pay | Admitting: Family Medicine

## 2023-10-17 MED ORDER — VITAMIN D 50 MCG (2000 UT) PO CAPS: 1.0000 | ORAL_CAPSULE | Freq: Every day | ORAL | 3 refills | Status: AC

## 2023-10-17 NOTE — Telephone Encounter (Signed)
[facility] faxed refill request for the following medications: ° °Vitamin D, Ergocalciferol, (DRISDOL) 1.25 MG (50000 UNIT) CAPS capsule  ° °Please advise. ° °

## 2024-01-14 ENCOUNTER — Telehealth: Payer: Self-pay | Admitting: Family Medicine

## 2024-01-14 NOTE — Telephone Encounter (Signed)
[facility] faxed refill request for the following medications:  levothyroxine (SYNTHROID) 125 MCG tablet    Please advise.

## 2024-01-17 ENCOUNTER — Telehealth: Payer: Self-pay | Admitting: Family Medicine

## 2024-01-17 MED ORDER — LEVOTHYROXINE SODIUM 125 MCG PO TABS: 125.0000 ug | ORAL_TABLET | Freq: Every day | ORAL | 0 refills | Status: AC

## 2024-01-17 NOTE — Telephone Encounter (Signed)
 Dr. Donzella wanted pt to schedule a physical with her. It's ok to schedule E2C2

## 2024-01-29 ENCOUNTER — Ambulatory Visit: Admitting: Dermatology

## 2024-02-11 ENCOUNTER — Telehealth: Payer: Self-pay | Admitting: Family Medicine

## 2024-02-11 ENCOUNTER — Other Ambulatory Visit: Payer: Self-pay

## 2024-02-11 NOTE — Telephone Encounter (Signed)
Converted to refill request 

## 2024-02-11 NOTE — Telephone Encounter (Signed)
[facility] faxed refill request for the following medications:  levothyroxine (SYNTHROID) 125 MCG tablet    Please advise.

## 2024-03-07 ENCOUNTER — Telehealth: Payer: Self-pay | Admitting: Family Medicine

## 2024-03-07 ENCOUNTER — Other Ambulatory Visit: Payer: Self-pay

## 2024-03-07 NOTE — Telephone Encounter (Signed)
 [facility] is requesting refills on Rosuvastatin  Calcium  10 mg. #90 with 3 RF

## 2024-03-07 NOTE — Telephone Encounter (Signed)
 Converted into a refill request

## 2024-03-16 ENCOUNTER — Other Ambulatory Visit: Payer: Self-pay | Admitting: Family Medicine
